# Patient Record
Sex: Male | Born: 1975 | Race: White | Hispanic: No | Marital: Married | Smoking: Never smoker
Health system: Southern US, Community
[De-identification: ages and names within clinical notes are randomized; demographics above are authoritative.]

## PROBLEM LIST (undated history)

## (undated) DIAGNOSIS — N200 Calculus of kidney: Secondary | ICD-10-CM

## (undated) HISTORY — PX: ADENOIDECTOMY: SUR15

## (undated) HISTORY — PX: OTHER SURGICAL HISTORY: SHX169

## (undated) HISTORY — DX: Calculus of kidney: N20.0

## (undated) HISTORY — PX: MOUTH SURGERY: SHX715

---

## 2004-06-27 ENCOUNTER — Emergency Department: Payer: Self-pay | Admitting: Emergency Medicine

## 2006-05-22 ENCOUNTER — Emergency Department: Payer: Self-pay | Admitting: Emergency Medicine

## 2013-12-19 ENCOUNTER — Ambulatory Visit: Payer: Self-pay

## 2016-10-05 ENCOUNTER — Emergency Department: Payer: 59

## 2016-10-05 ENCOUNTER — Emergency Department
Admission: EM | Admit: 2016-10-05 | Discharge: 2016-10-05 | Disposition: A | Discharge status: Home / Self Care | Payer: 59 | Attending: Emergency Medicine | Admitting: Emergency Medicine

## 2016-10-05 ENCOUNTER — Encounter: Payer: Self-pay | Admitting: Emergency Medicine

## 2016-10-05 DIAGNOSIS — N200 Calculus of kidney: Secondary | ICD-10-CM | POA: Diagnosis not present

## 2016-10-05 DIAGNOSIS — R1030 Lower abdominal pain, unspecified: Secondary | ICD-10-CM | POA: Diagnosis present

## 2016-10-05 LAB — URINALYSIS, COMPLETE (UACMP) WITH MICROSCOPIC
Bacteria, UA: NONE SEEN
Bilirubin Urine: NEGATIVE
GLUCOSE, UA: NEGATIVE mg/dL
KETONES UR: 20 mg/dL — AB
Leukocytes, UA: NEGATIVE
NITRITE: NEGATIVE
PH: 5 (ref 5.0–8.0)
PROTEIN: NEGATIVE mg/dL
Specific Gravity, Urine: 1.026 (ref 1.005–1.030)

## 2016-10-05 LAB — COMPREHENSIVE METABOLIC PANEL
ALBUMIN: 4.5 g/dL (ref 3.5–5.0)
ALT: 43 U/L (ref 17–63)
AST: 30 U/L (ref 15–41)
Alkaline Phosphatase: 59 U/L (ref 38–126)
Anion gap: 6 (ref 5–15)
BUN: 20 mg/dL (ref 6–20)
CHLORIDE: 109 mmol/L (ref 101–111)
CO2: 26 mmol/L (ref 22–32)
Calcium: 9.5 mg/dL (ref 8.9–10.3)
Creatinine, Ser: 1.03 mg/dL (ref 0.61–1.24)
GFR calc Af Amer: >60 mL/min (ref >60)
GFR calc non Af Amer: >60 mL/min (ref >60)
GLUCOSE: 120 mg/dL — AB (ref 65–99)
POTASSIUM: 3.9 mmol/L (ref 3.5–5.1)
Sodium: 141 mmol/L (ref 135–145)
Total Bilirubin: 0.8 mg/dL (ref 0.3–1.2)
Total Protein: 7.6 g/dL (ref 6.5–8.1)

## 2016-10-05 LAB — CBC
HCT: 44.4 % (ref 40.0–52.0)
HEMOGLOBIN: 15.5 g/dL (ref 13.0–18.0)
MCH: 31 pg (ref 26.0–34.0)
MCHC: 35 g/dL (ref 32.0–36.0)
MCV: 88.5 fL (ref 80.0–100.0)
PLATELETS: 254 10*3/uL (ref 150–440)
RBC: 5.01 MIL/uL (ref 4.40–5.90)
RDW: 13.7 % (ref 11.5–14.5)
WBC: 7.6 10*3/uL (ref 3.8–10.6)

## 2016-10-05 LAB — LIPASE, BLOOD: LIPASE: 38 U/L (ref 11–51)

## 2016-10-05 MED ORDER — ONDANSETRON 4 MG PO TBDP
4.0000 mg | ORAL_TABLET | Freq: Once | ORAL | Status: AC | PRN
  Administered 2016-10-05: 4 mg via ORAL
  Filled 2016-10-05: qty 1

## 2016-10-05 MED ORDER — TAMSULOSIN HCL 0.4 MG PO CAPS: 0.4000 mg | ORAL_CAPSULE | Freq: Every day | ORAL | 0 refills | Status: AC

## 2016-10-05 MED ORDER — TAMSULOSIN HCL 0.4 MG PO CAPS
0.4000 mg | ORAL_CAPSULE | Freq: Once | ORAL | Status: AC
  Administered 2016-10-05: 0.4 mg via ORAL
  Filled 2016-10-05: qty 1

## 2016-10-05 MED ORDER — OXYCODONE-ACETAMINOPHEN 5-325 MG PO TABS
1.0000 | ORAL_TABLET | Freq: Once | ORAL | Status: AC
  Administered 2016-10-05: 1 via ORAL
  Filled 2016-10-05: qty 1

## 2016-10-05 MED ORDER — OXYCODONE-ACETAMINOPHEN 5-325 MG PO TABS: 1.0000 | ORAL_TABLET | Freq: Four times a day (QID) | ORAL | 0 refills | Status: DC | PRN

## 2016-10-05 MED ORDER — KETOROLAC TROMETHAMINE 30 MG/ML IJ SOLN
15.0000 mg | Freq: Once | INTRAMUSCULAR | Status: AC
  Administered 2016-10-05: 15 mg via INTRAVENOUS
  Filled 2016-10-05: qty 1

## 2016-10-05 MED ORDER — FENTANYL CITRATE (PF) 100 MCG/2ML IJ SOLN
100.0000 ug | Freq: Once | INTRAMUSCULAR | Status: AC
  Administered 2016-10-05: 100 ug via INTRAVENOUS
  Filled 2016-10-05: qty 2

## 2016-10-05 NOTE — ED Triage Notes (Signed)
Pt states that he woke up around 0430 with extreme lower right sided abdominal pain. Pt states that he has urinary urgency and frequency since last night but denies any burning or odor to urine at this time. Pt is rocking back and forth in triage at this time.

## 2016-10-05 NOTE — ED Notes (Signed)
Marylene LandAngela RN aware of patient placement.

## 2016-10-05 NOTE — ED Provider Notes (Signed)
[facility] Emergency Department Provider Note  ____________________________________________   First MD Initiated Contact with Patient 10/05/16 0801     (approximate)  I have reviewed the triage vital signs and the nursing notes.   HISTORY  Chief Complaint Abdominal Pain    HPI Phillip Glenn is a 41 y.o. male who self presents to the emergency department with moderate to severe right sided flank pain radiating down to his groin that began last night shortly before going to bed. He initially went to sleep but woke up early in the morning with worsening pain. Associated with nausea but no vomiting. No fevers or chills. He has no history of abdominal surgeries. His pain is intermittent nothing seems to make it better or worse. At this time it is mild to moderate. He denies change in his urine. He denies dysuria, although does report urgency and frequency.   History reviewed. No pertinent past medical history.  There are no active problems to display for this patient.   Past Surgical History:  Procedure Laterality Date  . MOUTH SURGERY      Prior to Admission medications   Medication Sig Start Date End Date Taking? Authorizing Provider  oxyCODONE-acetaminophen (ROXICET) 5-325 MG tablet Take 1 tablet by mouth every 6 (six) hours as needed. 10/05/16 10/05/17  Merrily Brittle, MD  tamsulosin (FLOMAX) 0.4 MG CAPS capsule Take 1 capsule (0.4 mg total) by mouth daily. 10/05/16   Merrily Brittle, MD    Allergies Codeine  No family history on file.  Social History Social History  Substance Use Topics  . Smoking status: Never Smoker  . Smokeless tobacco: Never Used  . Alcohol use No    Review of Systems Constitutional: No fever/chills Eyes: No visual changes. ENT: No sore throat. Cardiovascular: Denies chest pain. Respiratory: Denies shortness of breath. Gastrointestinal: Positive abdominal pain.  Positive nausea, no vomiting.  No diarrhea.   No constipation. Genitourinary: Negative for dysuria. Musculoskeletal: Positive for back pain. Skin: Negative for rash. Neurological: Negative for headaches, focal weakness or numbness.   ____________________________________________   PHYSICAL EXAM:  VITAL SIGNS: ED Triage Vitals  Enc Vitals Group     BP 10/05/16 0457 (!) 163/90     Pulse Rate 10/05/16 0457 65     Resp 10/05/16 0457 18     Temp 10/05/16 0457 97.5 F (36.4 C)     Temp Source 10/05/16 0457 Oral     SpO2 10/05/16 0457 98 %     Weight 10/05/16 0459 (!) 310 lb (140.6 kg)     Height 10/05/16 0459 6\' 6"  (1.981 m)     Head Circumference --      Peak Flow --      Pain Score 10/05/16 0456 10     Pain Loc --      Pain Edu? --      Excl. in GC? --     Constitutional: Alert and oriented x 4 well appearing nontoxic no diaphoresis speaks in full, clear sentences Eyes: PERRL EOMI. Head: Atraumatic. Nose: No congestion/rhinnorhea. Mouth/Throat: No trismus Neck: No stridor.   Cardiovascular: Normal rate, regular rhythm. Grossly normal heart sounds.  Good peripheral circulation. Respiratory: Normal respiratory effort.  No retractions. Lungs CTAB and moving good air Gastrointestinal: Soft nondistended nontender no rebound or guarding no peritonitis no McBurney's tenderness negative Rovsing's no costovertebral tenderness Musculoskeletal: No lower extremity edema   Neurologic:  Normal speech and language. No gross focal neurologic deficits are appreciated. Skin:  Skin is  warm, dry and intact. No rash noted. Psychiatric: Mood and affect are normal. Speech and behavior are normal.    ____________________________________________   DIFFERENTIAL  Kidney stone, pyelonephritis, appendicitis, biliary colic, cholecystitis, volvulus, diverticulitis ____________________________________________   LABS (all labs ordered are listed, but only abnormal results are displayed)  Labs Reviewed  COMPREHENSIVE METABOLIC PANEL -  Abnormal; Notable for the following:       Result Value   Glucose, Bld 120 (*)    All other components within normal limits  URINALYSIS, COMPLETE (UACMP) WITH MICROSCOPIC - Abnormal; Notable for the following:    Color, Urine YELLOW (*)    APPearance HAZY (*)    Hgb urine dipstick LARGE (*)    Ketones, ur 20 (*)    Squamous Epithelial / LPF 0-5 (*)    All other components within normal limits  LIPASE, BLOOD  CBC    Hematuria with no signs of infection concerning for kidney stone __________________________________________  EKG   ____________________________________________  RADIOLOGY  CT scan shows a 2 mm stone with minimal hydronephrosis ____________________________________________   PROCEDURES  Procedure(s) performed: no  Procedures  Critical Care performed: no  Observation: no ____________________________________________   INITIAL IMPRESSION / ASSESSMENT AND PLAN / ED COURSE  Pertinent labs & imaging results that were available during my care of the patient were reviewed by me and considered in my medical decision making (see chart for details).  By the time I saw the patient she guarded been here several hours and he was relatively well-appearing but still appeared uncomfortable. His urinalysis is concerning for possible renal colic and as he's never had a stone before I think he warrants a CT scan to confirm this and to evaluate the location and size. The patient is agreeable and I will treat him with ketorolac and fentanyl in the meantime.     The patient does have a small kidney stone with only minimal hydronephrosis. Pain is significantly improved. At this point I will discharge him home with pain medication, Flomax, and strict return precautions. ____________________________________________   FINAL CLINICAL IMPRESSION(S) / ED DIAGNOSES  Final diagnoses:  Kidney stone      NEW MEDICATIONS STARTED DURING THIS VISIT:  Discharge Medication List as  of 10/05/2016  9:24 AM    START taking these medications   Details  oxyCODONE-acetaminophen (ROXICET) 5-325 MG tablet Take 1 tablet by mouth every 6 (six) hours as needed., Starting Sun 10/05/2016, Until Mon 10/05/2017, Print    tamsulosin (FLOMAX) 0.4 MG CAPS capsule Take 1 capsule (0.4 mg total) by mouth daily., Starting Sun 10/05/2016, Print         Note:  This document was prepared using Dragon voice recognition software and may include unintentional dictation errors.     Merrily Brittleifenbark, Rae S[place], MD 10/06/16 [phone]

## 2016-10-05 NOTE — Discharge Instructions (Addendum)
The most important thing to know about kidney stones is that if you develop a kidney infection with this stone it is life-threatening. If he develops a fever or chills he must return to the emergency department immediately for recheck. It is normal for your pain to last up to a full week. Please take her Percocet as needed for severe pain and follow-up with a urologist as needed.  It was a pleasure to take care of you today, and thank you for coming to our emergency department.  If you have any questions or concerns before leaving please ask the nurse to grab me and I'm more than happy to go through your aftercare instructions again.  If you were prescribed any opioid pain medication today such as Norco, Vicodin, Percocet, morphine, hydrocodone, or oxycodone please make sure you do not drive when you are taking this medication as it can alter your ability to drive safely.  If you have any concerns once you are home that you are not improving or are in fact getting worse before you can make it to your follow-up appointment, please do not hesitate to call 911 and come back for further evaluation.  Merrily Brittle MD  Results for orders placed or performed during the hospital encounter of 10/05/16  Lipase, blood  Result Value Ref Range   Lipase 38 11 - 51 U/L  Comprehensive metabolic panel  Result Value Ref Range   Sodium 141 135 - 145 mmol/L   Potassium 3.9 3.5 - 5.1 mmol/L   Chloride 109 101 - 111 mmol/L   CO2 26 22 - 32 mmol/L   Glucose, Bld 120 (H) 65 - 99 mg/dL   BUN 20 6 - 20 mg/dL   Creatinine, Ser 1.61 0.61 - 1.24 mg/dL   Calcium 9.5 8.9 - 09.6 mg/dL   Total Protein 7.6 6.5 - 8.1 g/dL   Albumin 4.5 3.5 - 5.0 g/dL   AST 30 15 - 41 U/L   ALT 43 17 - 63 U/L   Alkaline Phosphatase 59 38 - 126 U/L   Total Bilirubin 0.8 0.3 - 1.2 mg/dL   GFR calc non Af Amer >60 >60 mL/min   GFR calc Af Amer >60 >60 mL/min   Anion gap 6 5 - 15  CBC  Result Value Ref Range   WBC 7.6 3.8 - 10.6 K/uL   RBC 5.01 4.40 - 5.90 MIL/uL   Hemoglobin 15.5 13.0 - 18.0 g/dL   HCT 04.5 40.9 - 81.1 %   MCV 88.5 80.0 - 100.0 fL   MCH 31.0 26.0 - 34.0 pg   MCHC 35.0 32.0 - 36.0 g/dL   RDW 91.4 78.2 - 95.6 %   Platelets 254 150 - 440 K/uL  Urinalysis, Complete w Microscopic  Result Value Ref Range   Color, Urine YELLOW (A) YELLOW   APPearance HAZY (A) CLEAR   Specific Gravity, Urine 1.026 1.005 - 1.030   pH 5.0 5.0 - 8.0   Glucose, UA NEGATIVE NEGATIVE mg/dL   Hgb urine dipstick LARGE (A) NEGATIVE   Bilirubin Urine NEGATIVE NEGATIVE   Ketones, ur 20 (A) NEGATIVE mg/dL   Protein, ur NEGATIVE NEGATIVE mg/dL   Nitrite NEGATIVE NEGATIVE   Leukocytes, UA NEGATIVE NEGATIVE   RBC / HPF TOO NUMEROUS TO COUNT 0 - 5 RBC/hpf   WBC, UA 0-5 0 - 5 WBC/hpf   Bacteria, UA NONE SEEN NONE SEEN   Squamous Epithelial / LPF 0-5 (A) NONE SEEN   Mucous PRESENT  Ct Renal Stone Study  Result Date: 10/05/2016 CLINICAL DATA:  41 year old male with right flank and abdominal pain for 1 day. EXAM: CT ABDOMEN AND PELVIS WITHOUT CONTRAST TECHNIQUE: Multidetector CT imaging of the abdomen and pelvis was performed following the standard protocol without IV contrast. COMPARISON:  None. FINDINGS: Please note that parenchymal abnormalities may be missed without intravenous contrast. Lower chest: No acute abnormality Hepatobiliary: The liver and gallbladder are unremarkable. There is no evidence of biliary dilatation. Pancreas: Unremarkable Spleen: Unremarkable Adrenals/Urinary Tract: A 2 mm distal right ureteral calculus (5 cm above the UVJ) causes very mild right hydronephrosis. A punctate nonobstructing left renal calculus is noted. Two low-density left renal lesions probably represent cysts. The adrenal glands and bladder are unremarkable. Stomach/Bowel: Stomach is within normal limits. Appendix appears normal. No evidence of bowel wall thickening, distention, or inflammatory changes. Vascular/Lymphatic: No significant vascular  findings are present. No enlarged abdominal or pelvic lymph nodes. Reproductive: Prostate is unremarkable. Other: No ascites, focal collection or pneumoperitoneum. Musculoskeletal: No acute or significant osseous findings. IMPRESSION: 2 mm distal right ureteral calculus causing very mild right hydronephrosis. Punctate nonobstructing left renal calculus. Two low-density left renal lesions -likely cysts. Consider ultrasound confirmation. Electronically Signed   By: Harmon PierJeffrey  Hu M.D.   On: 10/05/2016 09:01

## 2016-10-07 ENCOUNTER — Encounter: Payer: Self-pay | Admitting: Emergency Medicine

## 2016-10-07 ENCOUNTER — Emergency Department
Admission: EM | Admit: 2016-10-07 | Discharge: 2016-10-07 | Disposition: A | Discharge status: Home / Self Care | Payer: 59 | Attending: Emergency Medicine | Admitting: Emergency Medicine

## 2016-10-07 DIAGNOSIS — R1110 Vomiting, unspecified: Secondary | ICD-10-CM | POA: Diagnosis present

## 2016-10-07 DIAGNOSIS — K5903 Drug induced constipation: Secondary | ICD-10-CM | POA: Diagnosis not present

## 2016-10-07 DIAGNOSIS — I10 Essential (primary) hypertension: Secondary | ICD-10-CM | POA: Diagnosis not present

## 2016-10-07 DIAGNOSIS — E860 Dehydration: Secondary | ICD-10-CM | POA: Insufficient documentation

## 2016-10-07 DIAGNOSIS — N200 Calculus of kidney: Secondary | ICD-10-CM | POA: Diagnosis not present

## 2016-10-07 LAB — COMPREHENSIVE METABOLIC PANEL
ALT: 34 U/L (ref 17–63)
AST: 23 U/L (ref 15–41)
Albumin: 4.1 g/dL (ref 3.5–5.0)
Alkaline Phosphatase: 60 U/L (ref 38–126)
Anion gap: 8 (ref 5–15)
BUN: 11 mg/dL (ref 6–20)
CHLORIDE: 108 mmol/L (ref 101–111)
CO2: 22 mmol/L (ref 22–32)
CREATININE: 1.45 mg/dL — AB (ref 0.61–1.24)
Calcium: 9.6 mg/dL (ref 8.9–10.3)
GFR calc Af Amer: >60 mL/min (ref >60)
GFR calc non Af Amer: 58 mL/min — ABNORMAL LOW (ref >60)
GLUCOSE: 109 mg/dL — AB (ref 65–99)
Potassium: 3.4 mmol/L — ABNORMAL LOW (ref 3.5–5.1)
Sodium: 138 mmol/L (ref 135–145)
Total Bilirubin: 1.4 mg/dL — ABNORMAL HIGH (ref 0.3–1.2)
Total Protein: 7.3 g/dL (ref 6.5–8.1)

## 2016-10-07 LAB — URINALYSIS, COMPLETE (UACMP) WITH MICROSCOPIC
Bacteria, UA: NONE SEEN
Bilirubin Urine: NEGATIVE
Glucose, UA: NEGATIVE mg/dL
Hgb urine dipstick: NEGATIVE
KETONES UR: 5 mg/dL — AB
Leukocytes, UA: NEGATIVE
Nitrite: NEGATIVE
PH: 5 (ref 5.0–8.0)
Protein, ur: NEGATIVE mg/dL
SPECIFIC GRAVITY, URINE: 1.021 (ref 1.005–1.030)

## 2016-10-07 LAB — CBC
HCT: 44.6 % (ref 40.0–52.0)
Hemoglobin: 15.3 g/dL (ref 13.0–18.0)
MCH: 30.3 pg (ref 26.0–34.0)
MCHC: 34.4 g/dL (ref 32.0–36.0)
MCV: 88.1 fL (ref 80.0–100.0)
PLATELETS: 255 10*3/uL (ref 150–440)
RBC: 5.06 MIL/uL (ref 4.40–5.90)
RDW: 13.3 % (ref 11.5–14.5)
WBC: 12.6 10*3/uL — ABNORMAL HIGH (ref 3.8–10.6)

## 2016-10-07 LAB — LIPASE, BLOOD: LIPASE: 30 U/L (ref 11–51)

## 2016-10-07 MED ORDER — ONDANSETRON 4 MG PO TBDP: 4.0000 mg | ORAL_TABLET | Freq: Three times a day (TID) | ORAL | 0 refills | Status: AC | PRN

## 2016-10-07 MED ORDER — OXYCODONE-ACETAMINOPHEN 5-325 MG PO TABS: 1.0000 | ORAL_TABLET | Freq: Four times a day (QID) | ORAL | 0 refills | Status: AC | PRN

## 2016-10-07 MED ORDER — ONDANSETRON 4 MG PO TBDP: 4.0000 mg | ORAL_TABLET | Freq: Three times a day (TID) | ORAL | 0 refills | Status: DC | PRN

## 2016-10-07 MED ORDER — FENTANYL CITRATE (PF) 100 MCG/2ML IJ SOLN
INTRAMUSCULAR | Status: AC
  Administered 2016-10-07: 50 ug via INTRAVENOUS
  Filled 2016-10-07: qty 2

## 2016-10-07 MED ORDER — ONDANSETRON HCL 4 MG/2ML IJ SOLN
INTRAMUSCULAR | Status: AC
  Administered 2016-10-07: 4 mg via INTRAVENOUS
  Filled 2016-10-07: qty 2

## 2016-10-07 MED ORDER — TAMSULOSIN HCL 0.4 MG PO CAPS
0.4000 mg | ORAL_CAPSULE | Freq: Once | ORAL | Status: AC
  Administered 2016-10-07: 0.4 mg via ORAL
  Filled 2016-10-07: qty 1

## 2016-10-07 MED ORDER — IBUPROFEN 800 MG PO TABS: 800.0000 mg | ORAL_TABLET | Freq: Three times a day (TID) | ORAL | 0 refills | Status: AC | PRN

## 2016-10-07 MED ORDER — MORPHINE SULFATE (PF) 4 MG/ML IV SOLN
4.0000 mg | Freq: Once | INTRAVENOUS | Status: AC
  Administered 2016-10-07: 4 mg via INTRAVENOUS
  Filled 2016-10-07: qty 1

## 2016-10-07 MED ORDER — SODIUM CHLORIDE 0.9 % IV BOLUS (SEPSIS)
1000.0000 mL | Freq: Once | INTRAVENOUS | Status: AC
  Administered 2016-10-07: 1000 mL via INTRAVENOUS

## 2016-10-07 MED ORDER — FENTANYL CITRATE (PF) 100 MCG/2ML IJ SOLN
50.0000 ug | INTRAMUSCULAR | Status: DC | PRN
  Administered 2016-10-07: 50 ug via INTRAVENOUS

## 2016-10-07 MED ORDER — KETOROLAC TROMETHAMINE 30 MG/ML IJ SOLN
15.0000 mg | Freq: Once | INTRAMUSCULAR | Status: AC
  Administered 2016-10-07: 15 mg via INTRAVENOUS
  Filled 2016-10-07: qty 1

## 2016-10-07 MED ORDER — ONDANSETRON HCL 4 MG/2ML IJ SOLN
4.0000 mg | Freq: Once | INTRAMUSCULAR | Status: AC
Start: 2016-10-07 — End: 2016-10-07
  Administered 2016-10-07: 4 mg via INTRAVENOUS
  Filled 2016-10-07: qty 2

## 2016-10-07 MED ORDER — ONDANSETRON HCL 4 MG/2ML IJ SOLN
4.0000 mg | Freq: Once | INTRAMUSCULAR | Status: AC | PRN
  Administered 2016-10-07: 4 mg via INTRAVENOUS

## 2016-10-07 NOTE — ED Provider Notes (Signed)
-----------------------------------------   10:30 PM on 10/07/2016 -----------------------------------------   END OF OBSERVATION STATUS: After an appropriate period of observation, this patient is being discharged due to the following reason(s):  Pain is well controlled and no signs of urinary tract infection.    Merrily Brittleifenbark, Analy [place], MD 10/07/16 2230

## 2016-10-07 NOTE — ED Provider Notes (Signed)
[facility] Emergency Department Provider Note  ____________________________________________  Time seen: Approximately 7:37 PM  I have reviewed the triage vital signs and the nursing notes.   HISTORY  Chief Complaint Abdominal Pain and Emesis   HPI Phillip Glenn is a 41 y.o. male diagnosed with a 2 mm right kidney stone 2 days ago who presents for evaluation of pain, nausea, vomiting, and inability to tolerate by mouth. Patient reports that he was sent home on Flomax and Percocet but was not given any anti-emetics. He has been vomiting since being discharged from the hospital. He is also complaining of severe pain that is located in his right flank radiating to his right groin has been constant since yesterday. Patient reports decreased urine output and said that he had not urinated the whole day yesterday. Patient has urinated 4 times today. No hematuria. No dysuria or frequency. He denies fever but has had chills. Patient also endorses 3 days of constipation which is not common for him. No prior abdominal surgeries, no abdominal distention, no history of SBO. Patient is passing flatus.  PMH Hyperlipidemia HTN Fatty liver   Past Surgical History:  Procedure Laterality Date  . MOUTH SURGERY      Prior to Admission medications   Medication Sig Start Date End Date Taking? Authorizing Provider  acetaminophen (TYLENOL) 500 MG tablet Take 1,000 mg by mouth every 6 (six) hours as needed.   Yes [provider]  tamsulosin (FLOMAX) 0.4 MG CAPS capsule Take 1 capsule (0.4 mg total) by mouth daily. 10/05/16  Yes Merrily Brittleifenbark, Neil, MD  ibuprofen (ADVIL,MOTRIN) 800 MG tablet Take 1 tablet (800 mg total) by mouth every 8 (eight) hours as needed. 10/07/16   Nita SickleVeronese, [place], MD  ondansetron (ZOFRAN ODT) 4 MG disintegrating tablet Take 1 tablet (4 mg total) by mouth every 8 (eight) hours as needed for nausea or vomiting. 10/07/16   Don PerkingVeronese, [place], MD    oxyCODONE-acetaminophen (ROXICET) 5-325 MG tablet Take 1 tablet by mouth every 6 (six) hours as needed. 10/07/16 10/07/17  Nita SickleVeronese, [place], MD    Allergies Codeine  Family History Colon cancer Father    Liver cancer Father    Cancer Other    Diabetes type II Other    Heart disease Other    Arthritis Other grandfather   Diabetes Other grandfather   Heart disease Other grandfather   Arthritis Other grandmother      Social History Social History  Substance Use Topics  . Smoking status: Never Smoker  . Smokeless tobacco: Never Used  . Alcohol use No    Review of Systems  Constitutional: Negative for fever. Eyes: Negative for visual changes. ENT: Negative for sore throat. Neck: No neck pain  Cardiovascular: Negative for chest pain. Respiratory: Negative for shortness of breath. Gastrointestinal: Negative for abdominal pain, diarrhea. + N/V Genitourinary: Negative for dysuria. + R flank pain Musculoskeletal: Negative for back pain. Skin: Negative for rash. Neurological: Negative for headaches, weakness or numbness. Psych: No SI or HI  ____________________________________________   PHYSICAL EXAM:  VITAL SIGNS: ED Triage Vitals  Enc Vitals Group     BP 10/07/16 1822 124/82     Pulse Rate 10/07/16 1822 77     Resp 10/07/16 1822 16     Temp 10/07/16 1822 98.7 F (37.1 C)     Temp Source 10/07/16 1822 Oral     SpO2 10/07/16 1822 98 %     Weight 10/07/16 1820 (!) 310 lb (140.6 kg)  Height 10/07/16 1820 6\' 6"  (1.981 m)     Head Circumference --      Peak Flow --      Pain Score 10/07/16 1820 9     Pain Loc --      Pain Edu? --      Excl. in GC? --     Constitutional: Alert and oriented, in obvious distress due to pain.  HEENT:      Head: Normocephalic and atraumatic.         Eyes: Conjunctivae are normal. Sclera is non-icteric.       Mouth/Throat: Mucous membranes are dry.       Neck: Supple with no signs of meningismus. Cardiovascular:  Regular rate and rhythm. No murmurs, gallops, or rubs. 2+ symmetrical distal pulses are present in all extremities. No JVD. Respiratory: Normal respiratory effort. Lungs are clear to auscultation bilaterally. No wheezes, crackles, or rhonchi.  Gastrointestinal: Soft, non tender, and non distended with positive bowel sounds. No rebound or guarding. Genitourinary: No CVA tenderness. Musculoskeletal: Nontender with normal range of motion in all extremities. No edema, cyanosis, or erythema of extremities. Neurologic: Normal speech and language. Face is symmetric. Moving all extremities. No gross focal neurologic deficits are appreciated. Skin: Skin is warm, dry and intact. No rash noted. Psychiatric: Mood and affect are normal. Speech and behavior are normal.  ____________________________________________   LABS (all labs ordered are listed, but only abnormal results are displayed)  Labs Reviewed  COMPREHENSIVE METABOLIC PANEL - Abnormal; Notable for the following:       Result Value   Potassium 3.4 (*)    Glucose, Bld 109 (*)    Creatinine, Ser 1.45 (*)    Total Bilirubin 1.4 (*)    GFR calc non Af Amer 58 (*)    All other components within normal limits  CBC - Abnormal; Notable for the following:    WBC 12.6 (*)    All other components within normal limits  LIPASE, BLOOD  URINALYSIS, COMPLETE (UACMP) WITH MICROSCOPIC   ____________________________________________  EKG  none  ____________________________________________  RADIOLOGY  none  ____________________________________________   PROCEDURES  Procedure(s) performed: None Procedures Critical Care performed:  None ____________________________________________   INITIAL IMPRESSION / ASSESSMENT AND PLAN / ED COURSE  41 y.o. male diagnosed with a 2 mm right kidney stone 2 days ago who presents for evaluation of pain, nausea, vomiting, and inability to tolerate by mouth. Patient looks dry and exam, has normal vital signs.  Patient is obviously uncomfortable. His abdomen is soft with no distention and no tenderness, has positive bowel sounds. Presentation concerning for pain and dehydration from his kidney stone. Creatinine is slightly increased to 1.45 from 1.03, 2 days ago. Leukocytosis with WBC 12.6. LFTs and lipase WNL. Will give IVF, toradol, morphine, flomax, zofran. UA pending to rule out superimposed infection.    ----------------------------------------- 7:37 PM on 10/07/2016 -----------------------------------------   OBSERVATION CARE: This patient is being placed under observation care for the following reasons: Kidney stone observed to repeat x-rays and/or adequate pain control for possible admission   _________________________ 8:30 PM on 10/07/2016 -----------------------------------------  Patient feels improved. UA still pending/ Care transferred to Dr. Lamont Snowball.     Pertinent labs & imaging results that were available during my care of the patient were reviewed by me and considered in my medical decision making (see chart for details).    ____________________________________________   FINAL CLINICAL IMPRESSION(S) / ED DIAGNOSES  Final diagnoses:  Kidney stone  Dehydration  Drug-induced  constipation      NEW MEDICATIONS STARTED DURING THIS VISIT:  New Prescriptions   IBUPROFEN (ADVIL,MOTRIN) 800 MG TABLET    Take 1 tablet (800 mg total) by mouth every 8 (eight) hours as needed.   ONDANSETRON (ZOFRAN ODT) 4 MG DISINTEGRATING TABLET    Take 1 tablet (4 mg total) by mouth every 8 (eight) hours as needed for nausea or vomiting.   OXYCODONE-ACETAMINOPHEN (ROXICET) 5-325 MG TABLET    Take 1 tablet by mouth every 6 (six) hours as needed.     Note:  This document was prepared using Dragon voice recognition software and may include unintentional dictation errors.    Don Perking, [place], MD 10/07/16 2031

## 2016-10-07 NOTE — ED Notes (Signed)

## 2016-10-07 NOTE — Discharge Instructions (Signed)
Constipation: Take colace twice a day everyday. Take senna once a day at bedtime. Take daily probiotics. Drink plenty of fluids and eat a diet rich in fiber. If you go more than 3 days without a bowel movement, take 1 cap full of Miralax in the morning and one in the evening up to 5 days.    You have been seen in the Emergency Department (ED)  Today and was diagnosed with kidney stones. While the stone is traveling through the ureter, which is the tube that carries urine from the kidney to the bladder, you will probably feel pain. The pain may be mild or very severe. You may also have some blood in your urine. As soon as the stone reaches the bladder, any intense pain should go away. If a stone is too large to pass on its own, you may need a medical procedure to help you pass the stone.   As we have discussed, please drink plenty of fluids and use a urinary strainer to attempt to capture the stone.  Please make a follow up appointment with Urology in the next week by calling the number below and bring the stone with you.  Take ibuprofen 600mg  every 6 hours for the pain. If the pain is not well controlled with ibuprofen you may take one percocet every 4 hours. Do not take tylenol while taking percocet. Please also take your prescribed flomax daily. Check with your doctor if you have a history of gastritis, stomach ulcers, renal failure or impaired kidney function as you may not be able to take ibuprofen/ motrin. Your doctor can give you a different prescription for pain control.  Follow-up with your doctor or return to the ER in 12-24 hours if your pain is not well controlled, if you develop pain or burning with urination, or if you develop a fever. Otherwise follow up in 3-5 days with your doctor.  When should you call for help?  Call your doctor now or seek immediate medical care if:  You cannot keep down fluids.  Your pain gets worse.  You have a fever or chills.  You have new or worse pain in your  back just below your rib cage (the flank area).  You have new or more blood in your urine. You have pain or burning with urination You are unable to urinate You have abdominal pain  Watch closely for changes in your health, and be sure to contact your doctor if:  You do not get better as expected  How can you care for yourself at home?  Drink plenty of fluids, enough so that your urine is light yellow or clear like water. If you have kidney, heart, or liver disease and have to limit fluids, talk with your doctor before you increase the amount of fluids you drink.  Take pain medicines exactly as directed. Call your doctor if you think you are having a problem with your medicine.  If the doctor gave you a prescription medicine for pain, take it as prescribed.  If you are not taking a prescription pain medicine, ask your doctor if you can take an over-the-counter medicine. Read and follow all instructions on the label. Your doctor may ask you to strain your urine so that you can collect your kidney stone when it passes. You can use a kitchen strainer or a tea strainer to catch the stone. Store it in a plastic bag until you see your doctor again.  Preventing future kidney stones  Some changes in your diet may help prevent kidney stones. Depending on the cause of your stones, your doctor may recommend that you:  Drink plenty of fluids, enough so that your urine is light yellow or clear like water. If you have kidney, heart, or liver disease and have to limit fluids, talk with your doctor before you increase the amount of fluids you drink.  Limit coffee, tea, and alcohol. Also avoid grapefruit juice.  Do not take more than the recommended daily dose of vitamins C and D.  Avoid antacids such as Gaviscon, Maalox, Mylanta, or Tums.  Limit the amount of salt (sodium) in your diet.  Eat a balanced diet that is not too high in protein.  Limit foods that are high in a substance called oxalate, which can  cause kidney stones. These foods include dark green vegetables, rhubarb, chocolate, wheat bran, nuts, cranberries, and beans.

## 2016-10-07 NOTE — ED Notes (Signed)
Pt reports that he did not urinate at all yesterday until he got in the shower late at night; that was the only way he could go and alleviate the pressure. Pt states he has urinated 4x today.

## 2016-10-07 NOTE — ED Triage Notes (Signed)
SEen through ED on Saturday evening and diagnosed with kidney stone. D/C home with flomax and Percocet.  Pain has persisted.  Patient continue to be nauseated.  Last medicated this morning, but patient unable to keep pills down.

## 2016-10-07 NOTE — ED Notes (Signed)
Pt presents with abdominal pain and nausea since Saturday morning. He was seen here on Sunday early morning. He was discharged with flomax and percocet. He has been taking them but has been vomiting every time he eats or drinks anything since Sunday. He states he has not had a bm since Sunday but has vomited 15 x in last 24 hours. Frequency of vomiting increased yesterday. Pt states he thinks he passed the stone, that the flank pain is not as bad, but "everything hurts now" and he can't even keep water down. Pt moaning during assessment.

## 2016-10-07 NOTE — ED Notes (Signed)
Pt assisted in standing at bedside to use urinal by this tech

## 2016-10-07 NOTE — ED Notes (Signed)
Pt aware urine specimen is needed. Pt verbalizes understanding of this. Urinal at bedside.

## 2016-10-10 ENCOUNTER — Emergency Department
Admission: EM | Admit: 2016-10-10 | Discharge: 2016-10-11 | Disposition: A | Discharge status: Home / Self Care | Payer: 59 | Attending: Emergency Medicine | Admitting: Emergency Medicine

## 2016-10-10 ENCOUNTER — Emergency Department: Payer: 59

## 2016-10-10 DIAGNOSIS — N201 Calculus of ureter: Secondary | ICD-10-CM | POA: Insufficient documentation

## 2016-10-10 DIAGNOSIS — K5900 Constipation, unspecified: Secondary | ICD-10-CM | POA: Diagnosis not present

## 2016-10-10 DIAGNOSIS — Z7982 Long term (current) use of aspirin: Secondary | ICD-10-CM | POA: Insufficient documentation

## 2016-10-10 DIAGNOSIS — R1084 Generalized abdominal pain: Secondary | ICD-10-CM | POA: Diagnosis not present

## 2016-10-10 DIAGNOSIS — N200 Calculus of kidney: Secondary | ICD-10-CM

## 2016-10-10 DIAGNOSIS — R109 Unspecified abdominal pain: Secondary | ICD-10-CM | POA: Diagnosis present

## 2016-10-10 DIAGNOSIS — R339 Retention of urine, unspecified: Secondary | ICD-10-CM | POA: Diagnosis not present

## 2016-10-10 LAB — CBC WITH DIFFERENTIAL/PLATELET
Basophils Absolute: 0.1 10*3/uL (ref 0–0.1)
Basophils Relative: 1 %
EOS ABS: 0.1 10*3/uL (ref 0–0.7)
EOS PCT: 1 %
HCT: 40.3 % (ref 40.0–52.0)
Hemoglobin: 14.1 g/dL (ref 13.0–18.0)
LYMPHS ABS: 1.1 10*3/uL (ref 1.0–3.6)
Lymphocytes Relative: 9 %
MCH: 30.3 pg (ref 26.0–34.0)
MCHC: 35 g/dL (ref 32.0–36.0)
MCV: 86.6 fL (ref 80.0–100.0)
Monocytes Absolute: 1 10*3/uL (ref 0.2–1.0)
Monocytes Relative: 8 %
Neutro Abs: 9.4 10*3/uL — ABNORMAL HIGH (ref 1.4–6.5)
Neutrophils Relative %: 81 %
PLATELETS: 269 10*3/uL (ref 150–440)
RBC: 4.66 MIL/uL (ref 4.40–5.90)
RDW: 13.1 % (ref 11.5–14.5)
WBC: 11.6 10*3/uL — AB (ref 3.8–10.6)

## 2016-10-10 LAB — LIPASE, BLOOD: Lipase: 27 U/L (ref 11–51)

## 2016-10-10 LAB — HEPATIC FUNCTION PANEL
ALT: 34 U/L (ref 17–63)
AST: 25 U/L (ref 15–41)
Albumin: 4 g/dL (ref 3.5–5.0)
Alkaline Phosphatase: 70 U/L (ref 38–126)
Bilirubin, Direct: 0.2 mg/dL (ref 0.1–0.5)
Indirect Bilirubin: 1 mg/dL — ABNORMAL HIGH (ref 0.3–0.9)
Total Bilirubin: 1.2 mg/dL (ref 0.3–1.2)
Total Protein: 7.3 g/dL (ref 6.5–8.1)

## 2016-10-10 LAB — BASIC METABOLIC PANEL
Anion gap: 7 (ref 5–15)
BUN: 15 mg/dL (ref 6–20)
CALCIUM: 9.6 mg/dL (ref 8.9–10.3)
CO2: 25 mmol/L (ref 22–32)
CREATININE: 1.73 mg/dL — AB (ref 0.61–1.24)
Chloride: 104 mmol/L (ref 101–111)
GFR calc Af Amer: 54 mL/min — ABNORMAL LOW (ref >60)
GFR, EST NON AFRICAN AMERICAN: 47 mL/min — AB (ref >60)
Glucose, Bld: 115 mg/dL — ABNORMAL HIGH (ref 65–99)
POTASSIUM: 4 mmol/L (ref 3.5–5.1)
SODIUM: 136 mmol/L (ref 135–145)

## 2016-10-10 MED ORDER — ONDANSETRON HCL 4 MG/2ML IJ SOLN
INTRAMUSCULAR | Status: AC
  Administered 2016-10-10: 4 mg via INTRAVENOUS
  Filled 2016-10-10: qty 2

## 2016-10-10 MED ORDER — MORPHINE SULFATE (PF) 4 MG/ML IV SOLN
INTRAVENOUS | Status: AC
  Administered 2016-10-10: 4 mg via INTRAVENOUS
  Filled 2016-10-10: qty 1

## 2016-10-10 MED ORDER — MORPHINE SULFATE (PF) 4 MG/ML IV SOLN
4.0000 mg | Freq: Once | INTRAVENOUS | Status: AC
  Administered 2016-10-10: 4 mg via INTRAVENOUS

## 2016-10-10 MED ORDER — ONDANSETRON HCL 4 MG/2ML IJ SOLN
4.0000 mg | Freq: Once | INTRAMUSCULAR | Status: AC
  Administered 2016-10-10: 4 mg via INTRAVENOUS

## 2016-10-10 MED ORDER — SODIUM CHLORIDE 0.9 % IV BOLUS (SEPSIS)
1000.0000 mL | Freq: Once | INTRAVENOUS | Status: AC
  Administered 2016-10-10: 1000 mL via INTRAVENOUS

## 2016-10-10 NOTE — ED Provider Notes (Signed)
[facility] Emergency Department Provider Note   ____________________________________________   First MD Initiated Contact with Patient 10/10/16 2325     (approximate)  I have reviewed the triage vital signs and the nursing notes.   HISTORY  Chief Complaint Flank Pain and Urinary Retention    HPI Phillip Glenn is a 41 y.o. male who comes into the hospital today with some abdominal pain and difficulty urinating. The patient was here last Sunday morning with severe right lower quadrant pain. The family was concerned that the patient may have had appendicitis but it was found that the patient had a kidney stone. He was sent home with oxycodone and Flomax. Since then he has had difficulty urinating and is only able to do soin a hot shower. She reports that he's been in a lot of painso they return to the emergency department on Tuesday. He had some severe dehydration and vomiting. He was given some fluids and had a bladder scan. The patient's wife reports that his bladder was full and it took him over 5 hours to urinate. He was told to follow-up with urology and has an appointment scheduled for this coming Wednesday. The patient's wife also reports that he has been unable to have a bowel movement for the past week. This is been going on since before he came with the kidney stone. He tried laxatives Colace and fiber all with no success. The patient rates his pain a 10 out of 10 in intensity. His pain is epigastric in the last time he urinated today was around 1 PM. The patient is here today for evaluation.   History reviewed. No pertinent past medical history.  There are no active problems to display for this patient.   Past Surgical History:  Procedure Laterality Date  . MOUTH SURGERY      Prior to Admission medications   Medication Sig Start Date End Date Taking? Authorizing Provider  acetaminophen (TYLENOL) 500 MG tablet Take 1,000 mg by mouth every 6  (six) hours as needed.   Yes [provider]  ibuprofen (ADVIL,MOTRIN) 800 MG tablet Take 1 tablet (800 mg total) by mouth every 8 (eight) hours as needed. 10/07/16  Yes Don Perking, [place], MD  ondansetron (ZOFRAN ODT) 4 MG disintegrating tablet Take 1 tablet (4 mg total) by mouth every 8 (eight) hours as needed for nausea or vomiting. 10/07/16  Yes Don Perking, [place], MD  oxyCODONE-acetaminophen (ROXICET) 5-325 MG tablet Take 1 tablet by mouth every 6 (six) hours as needed. 10/07/16 10/07/17 Yes Veronese, [place], MD  tamsulosin (FLOMAX) 0.4 MG CAPS capsule Take 1 capsule (0.4 mg total) by mouth daily. 10/05/16  Yes Merrily Brittle, MD  docusate sodium (COLACE) 100 MG capsule Take 1 capsule (100 mg total) by mouth 2 (two) times daily. 10/11/16 10/11/17  Rebecka Apley, MD  ketorolac (TORADOL) 10 MG tablet Take 1 tablet (10 mg total) by mouth every 6 (six) hours as needed. 10/11/16   Rebecka Apley, MD  metoCLOPramide (REGLAN) 10 MG tablet Take 1 tablet (10 mg total) by mouth 3 (three) times daily with meals. 10/11/16 10/11/17  Rebecka Apley, MD  polyethylene glycol [facility]) packet Take 17 g by mouth 2 (two) times daily. 10/11/16   Rebecka Apley, MD    Allergies Codeine  No family history on file.  Social History Social History  Substance Use Topics  . Smoking status: Never Smoker  . Smokeless tobacco: Never Used  . Alcohol use No  Review of Systems  Constitutional: No fever/chills Eyes: No visual changes. ENT: No sore throat. Cardiovascular: Denies chest pain. Respiratory: Denies shortness of breath. Gastrointestinal:  abdominal pain.  nausea, vomiting, constipation. Genitourinary: urinary retention Musculoskeletal: Negative for back pain. Skin: Negative for rash. Neurological: Negative for headaches, focal weakness or numbness.   ____________________________________________   PHYSICAL EXAM:  VITAL SIGNS: ED Triage Vitals  Enc Vitals Group     BP  10/10/16 2213 (!) 157/88     Pulse Rate 10/10/16 2213 63     Resp 10/10/16 2213 20     Temp 10/10/16 2213 98.4 F (36.9 C)     Temp Source 10/10/16 2213 Oral     SpO2 10/10/16 2213 99 %     Weight 10/10/16 2214 (!) 310 lb (140.6 kg)     Height 10/10/16 2214 6\' 6"  (1.981 m)     Head Circumference --      Peak Flow --      Pain Score 10/10/16 2213 10     Pain Loc --      Pain Edu? --      Excl. in GC? --     Constitutional: Alert and oriented. Well appearing and in moderate to severe distress. Eyes: Conjunctivae are normal. PERRL. EOMI. Head: Atraumatic. Nose: No congestion/rhinnorhea. Mouth/Throat: Mucous membranes are moist.  Oropharynx non-erythematous. Cardiovascular: Normal rate, regular rhythm. Grossly normal heart sounds.  Good peripheral circulation. Respiratory: Normal respiratory effort.  No retractions. Lungs CTAB. Gastrointestinal: diffuse abd tenderness to palpation with guarding. No distention .positive bowel sounds Musculoskeletal: No lower extremity tenderness nor edema.  Neurologic:  Normal speech and language.  Skin:  Skin is warm, dry and intact.  Psychiatric: Mood and affect are normal. Speech and behavior are normal.  ____________________________________________   LABS (all labs ordered are listed, but only abnormal results are displayed)  Labs Reviewed  CBC WITH DIFFERENTIAL/PLATELET - Abnormal; Notable for the following:       Result Value   WBC 11.6 (*)    Neutro Abs 9.4 (*)    All other components within normal limits  BASIC METABOLIC PANEL - Abnormal; Notable for the following:    Glucose, Bld 115 (*)    Creatinine, Ser 1.73 (*)    GFR calc non Af Amer 47 (*)    GFR calc Af Amer 54 (*)    All other components within normal limits  URINALYSIS, COMPLETE (UACMP) WITH MICROSCOPIC - Abnormal; Notable for the following:    Color, Urine AMBER (*)    APPearance CLEAR (*)    Hgb urine dipstick SMALL (*)    All other components within normal limits    HEPATIC FUNCTION PANEL - Abnormal; Notable for the following:    Indirect Bilirubin 1.0 (*)    All other components within normal limits  URINE CULTURE  LIPASE, BLOOD   ____________________________________________  EKG  none ____________________________________________  RADIOLOGY  Ct Renal Stone Study  Result Date: 10/11/2016 CLINICAL DATA:  Right flank pain with urinary retention EXAM: CT ABDOMEN AND PELVIS WITHOUT CONTRAST TECHNIQUE: Multidetector CT imaging of the abdomen and pelvis was performed following the standard protocol without IV contrast. COMPARISON:  10/05/2016 FINDINGS: Lower chest: Lung bases demonstrate no acute consolidation or pleural effusion. Normal heart size. Hepatobiliary: No focal liver abnormality is seen. No gallstones, gallbladder wall thickening, or biliary dilatation. Pancreas: Unremarkable. No pancreatic ductal dilatation or surrounding inflammatory changes. Spleen: Normal in size without focal abnormality. Adrenals/Urinary Tract: Adrenal glands are within normal limits. Low-density  lesions in the mid and upper left kidney with minimal calcification. Mild right hydronephrosis and hydroureter, secondary to a 3 mm stone which is now located at the right UVJ. Foley catheter in the bladder. Stomach/Bowel: Stomach is within normal limits. Appendix appears normal. No evidence of bowel wall thickening, distention, or inflammatory changes. Vascular/Lymphatic: No significant vascular findings are present. No enlarged abdominal or pelvic lymph nodes. Reproductive: Prostate is unremarkable. Other: Small amount of free fluid in the pelvis. No free air. Small fat in the umbilicus. Musculoskeletal: No acute or significant osseous findings. IMPRESSION: 1. Continued mild right hydronephrosis and hydroureter, this is secondary to a 2-3 mm stone in the distal right ureter which is now visualized at the right UVJ. 2. Low density lesions in the left kidney, probably cysts Electronically  Signed   By: Jasmine Pang M.D.   On: 10/11/2016 01:18    ____________________________________________   PROCEDURES  Procedure(s) performed: None  Procedures  Critical Care performed: No  ____________________________________________   INITIAL IMPRESSION / ASSESSMENT AND PLAN / ED COURSE  Pertinent labs & imaging results that were available during my care of the patient were reviewed by me and considered in my medical decision making (see chart for details).  His is a 41 year old male who comes into the hospital today with some severe abdominal pain, nausea and vomiting. The patient did have a kidney stone diagnosed approximately one week ago. It appears that the patient's creatinine is elevated at 1.73 which is higher than it was when he initially came a week ago. The patient receive a liter of normal saline. I will check hepatic function panel and a lipase and I will consider repeating the patient's CT scan for further evaluation. The patient will be reassessed He did receive some morphine for his pain.  Clinical Course as of Oct 11 229  Sat Oct 11, 2016  0128 IMPRESSION: 1. Continued mild right hydronephrosis and hydroureter, this is secondary to a 2-3 mm stone in the distal right ureter which is now visualized at the right UVJ. 2. Low density lesions in the left kidney, probably cysts   CT Renal [facility] [place] [AW]    Clinical Course User Index [AW] Rebecka Apley, MD   The patient's CT scan shows hydroureter secondary to a 2-3 mm stone at the right UVJ. The patient had some relief after his catheter was placed. He started having pain again and nausea so I did give him some Toradol and Reglan. The patient is also constipated. He received a liter of normal saline. I will discharge the patient to follow-up with urology. He will have a leg bag as he does not have any infection and I'll also give him medication for constipation. The patient be discharged to follow-up. He has no  further questions at this time.  ____________________________________________   FINAL CLINICAL IMPRESSION(S) / ED DIAGNOSES  Final diagnoses:  Kidney stone  Urinary retention  Constipation, unspecified constipation type  Generalized abdominal pain      NEW MEDICATIONS STARTED DURING THIS VISIT:  New Prescriptions   DOCUSATE SODIUM (COLACE) 100 MG CAPSULE    Take 1 capsule (100 mg total) by mouth 2 (two) times daily.   KETOROLAC (TORADOL) 10 MG TABLET    Take 1 tablet (10 mg total) by mouth every 6 (six) hours as needed.   METOCLOPRAMIDE (REGLAN) 10 MG TABLET    Take 1 tablet (10 mg total) by mouth 3 (three) times daily with meals.   POLYETHYLENE GLYCOL (MIRALAX)  PACKET    Take 17 g by mouth 2 (two) times daily.     Note:  This document was prepared using Dragon voice recognition software and may include unintentional dictation errors.    Rebecka ApleyWebster, Pancho Rushing P, MD 10/11/16 [phone]

## 2016-10-10 NOTE — ED Notes (Signed)
Bladder scan showed 240 ml urine.

## 2016-10-10 NOTE — ED Triage Notes (Signed)
Pt comes via [facility] from home with c/o right flank pain and urinary retention. Pt states he was dx [facility] at [facility] ED with kidney stones. He also states he hasn't had a BM in over a week. Pt states he took some Zofran before EMS was called and took some oxycodone this am with no relief. Pt states he has also taken 600mg  of Ibuprofen at 1:00 today still no relief. Per EMS they gave pt 30 of tardal. Pt appears to be in severe pain. Respirations even and unlabored.

## 2016-10-10 NOTE — ED Notes (Signed)
Pt. States he has been constipated for the past 9 days.  Pt. States he was here this past Sunday and dx with kidney stones.  Pt. States "I think I passed kidney stones".  Pt. Reports abdominal cramping.

## 2016-10-11 LAB — URINALYSIS, COMPLETE (UACMP) WITH MICROSCOPIC
Bacteria, UA: NONE SEEN
Bilirubin Urine: NEGATIVE
GLUCOSE, UA: NEGATIVE mg/dL
Ketones, ur: NEGATIVE mg/dL
Leukocytes, UA: NEGATIVE
Nitrite: NEGATIVE
PH: 5 (ref 5.0–8.0)
PROTEIN: NEGATIVE mg/dL
SPECIFIC GRAVITY, URINE: 1.024 (ref 1.005–1.030)
Squamous Epithelial / LPF: NONE SEEN

## 2016-10-11 MED ORDER — KETOROLAC TROMETHAMINE 10 MG PO TABS: 10.0000 mg | ORAL_TABLET | Freq: Four times a day (QID) | ORAL | 0 refills | Status: AC | PRN

## 2016-10-11 MED ORDER — POLYETHYLENE GLYCOL 3350 17 G PO PACK: 17.0000 g | PACK | Freq: Two times a day (BID) | ORAL | 0 refills | Status: AC

## 2016-10-11 MED ORDER — SODIUM CHLORIDE 0.9 % IV BOLUS (SEPSIS)
1000.0000 mL | Freq: Once | INTRAVENOUS | Status: AC
  Administered 2016-10-11: 1000 mL via INTRAVENOUS

## 2016-10-11 MED ORDER — METOCLOPRAMIDE HCL 5 MG/ML IJ SOLN
10.0000 mg | Freq: Once | INTRAMUSCULAR | Status: AC
  Administered 2016-10-11: 10 mg via INTRAVENOUS
  Filled 2016-10-11: qty 2

## 2016-10-11 MED ORDER — DOCUSATE SODIUM 100 MG PO CAPS: 100.0000 mg | ORAL_CAPSULE | Freq: Two times a day (BID) | ORAL | 0 refills | Status: AC

## 2016-10-11 MED ORDER — KETOROLAC TROMETHAMINE 30 MG/ML IJ SOLN
30.0000 mg | Freq: Once | INTRAMUSCULAR | Status: AC
  Administered 2016-10-11: 30 mg via INTRAVENOUS
  Filled 2016-10-11: qty 1

## 2016-10-11 MED ORDER — METOCLOPRAMIDE HCL 10 MG PO TABS: 10.0000 mg | ORAL_TABLET | Freq: Three times a day (TID) | ORAL | 1 refills | Status: AC

## 2016-10-11 NOTE — ED Notes (Signed)
Pt. Stated he had large bowel movement in room, pt. Feeling relief.

## 2016-10-11 NOTE — ED Notes (Signed)
Pt. Given instructions and education on foley and two different bags.

## 2016-10-11 NOTE — ED Notes (Signed)
Pt. Requested to try to have bowel movement before going home.  Pt. In room with wife.  Pt. Will hit call bell when ready.

## 2016-10-11 NOTE — Discharge Instructions (Signed)
Please follow up with urology and your primary care physician

## 2016-10-11 NOTE — ED Notes (Signed)
Pt. Going howe with wife.

## 2016-10-12 LAB — URINE CULTURE: CULTURE: NO GROWTH

## 2016-10-14 NOTE — Progress Notes (Signed)
10/15/2016 9:44 AM   Phillip Glenn Nov 28, 1975 [id number]  Referring provider: No referring provider defined for this encounter.  Chief Complaint  Patient presents with  . New Patient (Initial Visit)    referred by ER kidney stones    HPI: Patient is a 41 year old Caucasian male who is referred by [facility] ED for nephrolithiasis.  Patient states the onset of the pain was 10 days ago.   It was sharp in nature.  It lasted for several hours.  The pain was located in the right flank and radiated to his right groin.  The pain was a 7-8/10.  Nothing made the pain better.   Nothing made the pain worse.  He did not have gross hematuria, fevers or chills.  He did have nausea and vomiting.  He sought treatment in the ED on 10/05/2016.  CT Renal stone study at that time demonstrated a 2 mm distal right ureteral calculus causing very mild right hydronephrosis.   Punctate nonobstructing left renal calculus.  Two low-density left renal lesions -likely cysts. Consider ultrasound confirmation.  I have personally reviewed the films.  His serum creatinine was 1.03, his WBC count was 7.6 and his UA noted to numerous to count red blood cells.  He was given Toradol, fentanyl and Zofran in the ED. He was discharged with Flomax and Percocet.  He returned to the ED on 10/07/2016 with the complaint of intractable vomiting and pain.  He reported decreased urine output and said that he had not urinated the whole day prior to seeking treatment in the ED.  Patient had urinated 4 times the day of his ED visit. . No hematuria. No dysuria or frequency. He denied fever but has had chills. Patient also endorsed 3 days of constipation which is not common for him. No prior abdominal surgeries, no abdominal distention, no history of SBO. Patient is passing flatus. His serum creatinine had elevated to 1.45 and his WBC count elevated to 12.6,.  His UA was unremarkable.  He was given morphine, Toradol, ibuprofen and Zofran in  the ED.   He returned again on 10/10/2016 with some abdominal pain and difficulty urinating.  He has been unable to have a bowel movement for the past week. This is been going on since before he came with the kidney stone. He tried laxatives Colace and fiber all with no success. The patient rated his pain a 10 out of 10 in intensity. His pain is epigastric in the last time he urinated today was around 1 PM.  A repeated CT Renal stone study noted continued mild right hydronephrosis and hydroureter, this is secondary to a 2-3 mm stone in the distal right ureter which is now visualized at the right UVJ.  It may have passed into the bladder.  Low density lesions in the left kidney, probably cysts.  I have independently reviewed the films.  In the emergency room he was given Zofran, morphine, Toradol, Reglan, Colace and MiraLAX.  His serum creatinine at that time was 1.73, his WBC count was 11.6, his UA was unremarkable and his urine culture was negative.  A Foley was placed and 240 cc residual was received.  Today, he has the Foley in place.  He is desiring to have it removed.  He states he has had gross hematuria with the Foley.  He is having left flank pain today.  The right flank is not bothersome to him.  He has not passed a fragment. He has  not been given a strainer, so therefore he has not been straining his urine.  He has not had any recent fevers, chills, nausea or vomiting. He states this is improved as he is starting to have bowel movements again and has been switched to Toradol for the pain. He states he is taking one Toradol daily.  He also states that he has a history of a "shy" bladder and ED.  He states both issues have been occurring for several months.  He states that he is developing a weaker and weaker urinary stream. He states he is drinking copious amounts of fluid and effort to lose weight as he has a fatty liver.  In reference to the erectile dysfunction, he states he is able to get an  erection that they are starting to lack of firmness and frequency as he had in the past.     PMH: No past medical history on file.  Surgical History: Past Surgical History:  Procedure Laterality Date  . MOUTH SURGERY      Home Medications:  Allergies as of 10/15/2016      Reactions   Ace Inhibitors Hives   Codeine    Doxycycline Hives, Rash      Medication List       Accurate as of 10/15/16  9:44 AM. Always use your most recent med list.          acetaminophen 500 MG tablet Commonly known as:  TYLENOL Take 1,000 mg by mouth every 6 (six) hours as needed.   docusate sodium 100 MG capsule Commonly known as:  COLACE Take 1 capsule (100 mg total) by mouth 2 (two) times daily.   ibuprofen 800 MG tablet Commonly known as:  ADVIL,MOTRIN Take 1 tablet (800 mg total) by mouth every 8 (eight) hours as needed.   ketorolac 10 MG tablet Commonly known as:  TORADOL Take 1 tablet (10 mg total) by mouth every 6 (six) hours as needed.   metoCLOPramide 10 MG tablet Commonly known as:  REGLAN Take 1 tablet (10 mg total) by mouth 3 (three) times daily with meals.   ondansetron 4 MG disintegrating tablet Commonly known as:  ZOFRAN ODT Take 1 tablet (4 mg total) by mouth every 8 (eight) hours as needed for nausea or vomiting.   oxyCODONE-acetaminophen 5-325 MG tablet Commonly known as:  ROXICET Take 1 tablet by mouth every 6 (six) hours as needed.   polyethylene glycol packet Commonly known as:  MIRALAX Take 17 g by mouth 2 (two) times daily.   tamsulosin 0.4 MG Caps capsule Commonly known as:  FLOMAX Take 1 capsule (0.4 mg total) by mouth daily.       Allergies:  Allergies  Allergen Reactions  . Ace Inhibitors Hives  . Codeine   . Doxycycline Hives and Rash    Family History: Family History  Problem Relation Age of Onset  . Colon cancer Father   . Liver cancer Father   . Kidney cancer Neg Hx   . Bladder Cancer Neg Hx   . Prostate cancer Neg Hx     Social  History:  reports that he has never smoked. He has never used smokeless tobacco. He reports that he does not drink alcohol or use drugs.  ROS: UROLOGY Frequent Urination?: No Hard to postpone urination?: No Burning/pain with urination?: No Get up at night to urinate?: No Leakage of urine?: No Urine stream starts and stops?: Yes Trouble starting stream?: Yes Do you have to strain to urinate?:  Yes Blood in urine?: Yes Urinary tract infection?: No Sexually transmitted disease?: No Injury to kidneys or bladder?: Yes Painful intercourse?: No Weak stream?: Yes Erection problems?: Yes Penile pain?: No  Gastrointestinal Nausea?: Yes Vomiting?: Yes Indigestion/heartburn?: No Diarrhea?: No Constipation?: Yes  Constitutional Fever: No Night sweats?: Yes Weight loss?: No Fatigue?: Yes  Skin Skin rash/lesions?: No Itching?: No  Eyes Blurred vision?: No Double vision?: No  Ears/Nose/Throat Sore throat?: No Sinus problems?: No  Hematologic/Lymphatic Swollen glands?: No Easy bruising?: No  Cardiovascular Leg swelling?: No Chest pain?: No  Respiratory Cough?: Yes Shortness of breath?: No  Endocrine Excessive thirst?: No  Musculoskeletal Back pain?: Yes Joint pain?: Yes  Neurological Headaches?: Yes Dizziness?: Yes  Psychologic Depression?: No Anxiety?: No  Physical Exam: BP (!) 159/107   Pulse 78   Ht 6' 6"  (1.981 m)   Wt (!) 313 lb 9.6 oz (142.2 kg)   BMI 36.24 kg/m   Constitutional: Well nourished. Alert and oriented, No acute distress. HEENT: [place] AT, moist mucus membranes. Trachea midline, no masses. Cardiovascular: No clubbing, cyanosis, or edema. Respiratory: Normal respiratory effort, no increased work of breathing. GI: Abdomen is soft, non tender, non distended, no abdominal masses. Liver and spleen not palpable.  No hernias appreciated.  Stool sample for occult testing is not indicated.   GU: No CVA tenderness.  No bladder fullness or  masses.  Patient with circumcised phallus. Urethral meatus is patent.  No penile discharge. No penile lesions or rashes. Scrotum without lesions, cysts, rashes and/or edema.  Testicles are located scrotally bilaterally. No masses are appreciated in the testicles. Left and right epididymis are normal. Rectal: Deferred.   Skin: No rashes, bruises or suspicious lesions. Lymph: No cervical or inguinal adenopathy. Neurologic: Grossly intact, no focal deficits, moving all 4 extremities. Psychiatric: Normal mood and affect.  Laboratory Data: Lab Results  Component Value Date   WBC 11.6 (H) 10/10/2016   HGB 14.1 10/10/2016   HCT 40.3 10/10/2016   MCV 86.6 10/10/2016   PLT 269 10/10/2016    Lab Results  Component Value Date   CREATININE 1.73 (H) 10/10/2016    Lab Results  Component Value Date   AST 25 10/10/2016   Lab Results  Component Value Date   ALT 34 10/10/2016     Pertinent Imaging: CLINICAL DATA:  41 year old male with right flank and abdominal pain for 1 day.  EXAM: CT ABDOMEN AND PELVIS WITHOUT CONTRAST  TECHNIQUE: Multidetector CT imaging of the abdomen and pelvis was performed following the standard protocol without IV contrast.  COMPARISON:  None.  FINDINGS: Please note that parenchymal abnormalities may be missed without intravenous contrast.  Lower chest: No acute abnormality  Hepatobiliary: The liver and gallbladder are unremarkable. There is no evidence of biliary dilatation.  Pancreas: Unremarkable  Spleen: Unremarkable  Adrenals/Urinary Tract: A 2 mm distal right ureteral calculus (5 cm above the UVJ) causes very mild right hydronephrosis. A punctate nonobstructing left renal calculus is noted. Two low-density left renal lesions probably represent cysts. The adrenal glands and bladder are unremarkable.  Stomach/Bowel: Stomach is within normal limits. Appendix appears normal. No evidence of bowel wall thickening, distention,  or inflammatory changes.  Vascular/Lymphatic: No significant vascular findings are present. No enlarged abdominal or pelvic lymph nodes.  Reproductive: Prostate is unremarkable.  Other: No ascites, focal collection or pneumoperitoneum.  Musculoskeletal: No acute or significant osseous findings.  IMPRESSION: 2 mm distal right ureteral calculus causing very mild right hydronephrosis.  Punctate nonobstructing left renal calculus.  Two low-density left renal lesions -likely cysts. Consider ultrasound confirmation.   Electronically Signed   By: Margarette Canada M.D.   On: 10/05/2016 09:01  CLINICAL DATA:  Right flank pain with urinary retention  EXAM: CT ABDOMEN AND PELVIS WITHOUT CONTRAST  TECHNIQUE: Multidetector CT imaging of the abdomen and pelvis was performed following the standard protocol without IV contrast.  COMPARISON:  10/05/2016  FINDINGS: Lower chest: Lung bases demonstrate no acute consolidation or pleural effusion. Normal heart size.  Hepatobiliary: No focal liver abnormality is seen. No gallstones, gallbladder wall thickening, or biliary dilatation.  Pancreas: Unremarkable. No pancreatic ductal dilatation or surrounding inflammatory changes.  Spleen: Normal in size without focal abnormality.  Adrenals/Urinary Tract: Adrenal glands are within normal limits. Low-density lesions in the mid and upper left kidney with minimal calcification. Mild right hydronephrosis and hydroureter, secondary to a 3 mm stone which is now located at the right UVJ. Foley catheter in the bladder.  Stomach/Bowel: Stomach is within normal limits. Appendix appears normal. No evidence of bowel wall thickening, distention, or inflammatory changes.  Vascular/Lymphatic: No significant vascular findings are present. No enlarged abdominal or pelvic lymph nodes.  Reproductive: Prostate is unremarkable.  Other: Small amount of free fluid in the pelvis. No free  air. Small fat in the umbilicus.  Musculoskeletal: No acute or significant osseous findings.  IMPRESSION: 1. Continued mild right hydronephrosis and hydroureter, this is secondary to a 2-3 mm stone in the distal right ureter which is now visualized at the right UVJ. 2. Low density lesions in the left kidney, probably cysts   Electronically Signed   By: Donavan Foil M.D.   On: 10/11/2016 01:18      Assessment & Plan:   1. Right ureteral stone  - explained to the patient that AUA Guidelines for patients with uncomplicated ureteral stones ?10 mm should be offered observation, and those with distal stones of similar size should be offered MET with ?-blockers  - no further right flank pain - stone most likely passed - strainer is given - will continue the tamsulosin  - Advised to contact our office or seek treatment in the ED if becomes febrile or pain/ vomiting are difficult control in order to arrange for emergent/urgent intervention   2. Right hydronephrosis  - obtain RUS to ensure the hydronephrosis has resolved.    3. Left renal stone  - Patient now with left flank pain  - He will be undergoing a renal ultrasound  - Advised to contact our office or seek treatment in the ED if becomes febrile or pain/ vomiting are difficult control in order to arrange for emergent/urgent intervention  4. Incomplete bladder emptying  - foley catheter removed  -voiding trial today    -return if unable to urinate or experiencing suprapubic discomfort  -Continue the tamsulosin  -follow-up in one month for I PSS score, PVR and exam.  5. Erectile dysfunction  - Patient advised to speak with his PCP regarding cardiac risk factors as erectile dysfunction is a possible sign of cardiac disease especially in younger men - he did admit he has hyperlipidemia  6. Left renal lesion  - RUS pending     Return for RUS report .  These notes generated with voice recognition software. I apologize  for typographical errors.  Zara Council, [facility] [address] [place], [place] [id number] [phone]

## 2016-10-15 ENCOUNTER — Encounter: Payer: Self-pay | Admitting: Urology

## 2016-10-15 ENCOUNTER — Ambulatory Visit (INDEPENDENT_AMBULATORY_CARE_PROVIDER_SITE_OTHER): Payer: 59 | Admitting: Urology

## 2016-10-15 VITALS — BP 159/107 | HR 78 | Ht 78.0 in | Wt 313.6 lb

## 2016-10-15 DIAGNOSIS — R339 Retention of urine, unspecified: Secondary | ICD-10-CM

## 2016-10-15 DIAGNOSIS — N200 Calculus of kidney: Secondary | ICD-10-CM | POA: Diagnosis not present

## 2016-10-15 DIAGNOSIS — N201 Calculus of ureter: Secondary | ICD-10-CM | POA: Diagnosis not present

## 2016-10-15 DIAGNOSIS — N132 Hydronephrosis with renal and ureteral calculous obstruction: Secondary | ICD-10-CM

## 2016-10-15 DIAGNOSIS — N171 Acute kidney failure with acute cortical necrosis: Secondary | ICD-10-CM | POA: Diagnosis not present

## 2016-10-15 DIAGNOSIS — N529 Male erectile dysfunction, unspecified: Secondary | ICD-10-CM | POA: Diagnosis not present

## 2016-11-04 ENCOUNTER — Ambulatory Visit
Admission: RE | Admit: 2016-11-04 | Discharge: 2016-11-04 | Disposition: A | Discharge status: Home / Self Care | Payer: 59 | Source: Ambulatory Visit | Attending: Urology | Admitting: Urology

## 2016-11-04 DIAGNOSIS — N281 Cyst of kidney, acquired: Secondary | ICD-10-CM | POA: Diagnosis not present

## 2016-11-04 DIAGNOSIS — R109 Unspecified abdominal pain: Secondary | ICD-10-CM | POA: Diagnosis not present

## 2016-11-04 DIAGNOSIS — N200 Calculus of kidney: Secondary | ICD-10-CM

## 2016-11-04 DIAGNOSIS — N132 Hydronephrosis with renal and ureteral calculous obstruction: Secondary | ICD-10-CM

## 2016-11-09 NOTE — Progress Notes (Signed)
11/10/2016 2:20 PM   Phillip Glenn 02-12-1976 [id number]  Referring provider: No referring provider defined for this encounter.  Chief Complaint  Patient presents with  . Results    RUS    HPI: 41 yo WM who presents today for a RUS report.  Background history Patient is a 41 year old Caucasian male who is referred by [facility] ED for nephrolithiasis.  Patient states the onset of the pain was 10 days ago.   It was sharp in nature.  It lasted for several hours.  The pain was located in the right flank and radiated to his right groin.  The pain was a 7-8/10.  Nothing made the pain better.   Nothing made the pain worse.  He did not have gross hematuria, fevers or chills.  He did have nausea and vomiting.  He sought treatment in the ED on 10/05/2016.  CT Renal stone study at that time demonstrated a 2 mm distal right ureteral calculus causing very mild right hydronephrosis.  Punctate nonobstructing left renal calculus.  Two low-density left renal lesions -likely cysts. Consider ultrasound confirmation.  I have personally reviewed the films.  His serum creatinine was 1.03, his WBC count was 7.6 and his UA noted to numerous to count red blood cells.  He was given Toradol, fentanyl and Zofran in the ED. He was discharged with Flomax and Percocet.  He returned to the ED on 10/07/2016 with the complaint of intractable vomiting and pain.  He reported decreased urine output and said that he had not urinated the whole day prior to seeking treatment in the ED.  Patient had urinated 4 times the day of his ED visit. . No hematuria. No dysuria or frequency. He denied fever but has had chills. Patient also endorsed 3 days of constipation which is not common for him. No prior abdominal surgeries, no abdominal distention, no history of SBO. Patient is passing flatus. His serum creatinine had elevated to 1.45 and his WBC count elevated to 12.6,.  His UA was unremarkable.  He was given morphine, Toradol,  ibuprofen and Zofran in the ED.  He returned again on 10/10/2016 with some abdominal pain and difficulty urinating.  He has been unable to have a bowel movement for the past week. This is been going on since before he came with the kidney stone. He tried laxatives Colace and fiber all with no success. The patient rated his pain a 10 out of 10 in intensity. His pain is epigastric in the last time he urinated today was around 1 PM.  A repeated CT Renal stone study noted continued mild right hydronephrosis and hydroureter, this is secondary to a 2-3 mm stone in the distal right ureter which is now visualized at the right UVJ.  It may have passed into the bladder.  Low density lesions in the left kidney, probably cysts.  I have independently reviewed the films.  In the emergency room he was given Zofran, morphine, Toradol, Reglan, Colace and MiraLAX.  His serum creatinine at that time was 1.73, his WBC count was 11.6, his UA was unremarkable and his urine culture was negative.  A Foley was placed and 240 cc residual was received.  Today, he has the Foley in place.  He is desiring to have it removed.  He states he has had gross hematuria with the Foley.  He is having left flank pain today.  The right flank is not bothersome to him.  He has not passed  a fragment. He has not been given a strainer, so therefore he has not been straining his urine.  He has not had any recent fevers, chills, nausea or vomiting. He states this is improved as he is starting to have bowel movements again and has been switched to Toradol for the pain. He states he is taking one Toradol daily.  He also states that he has a history of a "shy" bladder and ED.  He states both issues have been occurring for several months.  He states that he is developing a weaker and weaker urinary stream. He states he is drinking copious amounts of fluid and effort to lose weight as he has a fatty liver.  In reference to the erectile dysfunction, he states he is  able to get an erection that they are starting to lack of firmness and frequency as he had in the past.    Today, he is still having some mild right flank pain.  He has not passed a stone.  He is not having gross hematuria or dysuria.  He is not having fevers, chills, nausea or vomiting.     He is urinating better on the tamsulosin and his erections have improved.    RUS performed on 11/04/2016 noted no acute finding by renal ultrasound.  Negative for hydronephrosis.  Minimally complex septated left renal cysts (2).   PMH: Past Medical History:  Diagnosis Date  . Kidney stone     Surgical History: Past Surgical History:  Procedure Laterality Date  . ADENOIDECTOMY    . MOUTH SURGERY    . tubes in ear      Home Medications:  Allergies as of 11/10/2016      Reactions   Ace Inhibitors Hives   Codeine    Doxycycline Hives, Rash      Medication List       Accurate as of 11/10/16  2:20 PM. Always use your most recent med list.          acetaminophen 500 MG tablet Commonly known as:  TYLENOL Take 1,000 mg by mouth every 6 (six) hours as needed.   docusate sodium 100 MG capsule Commonly known as:  COLACE Take 1 capsule (100 mg total) by mouth 2 (two) times daily.   ibuprofen 800 MG tablet Commonly known as:  ADVIL,MOTRIN Take 1 tablet (800 mg total) by mouth every 8 (eight) hours as needed.   ketorolac 10 MG tablet Commonly known as:  TORADOL Take 1 tablet (10 mg total) by mouth every 6 (six) hours as needed.   metoCLOPramide 10 MG tablet Commonly known as:  REGLAN Take 1 tablet (10 mg total) by mouth 3 (three) times daily with meals.   ondansetron 4 MG disintegrating tablet Commonly known as:  ZOFRAN ODT Take 1 tablet (4 mg total) by mouth every 8 (eight) hours as needed for nausea or vomiting.   oxyCODONE-acetaminophen 5-325 MG tablet Commonly known as:  ROXICET Take 1 tablet by mouth every 6 (six) hours as needed.   polyethylene glycol packet Commonly known  as:  MIRALAX Take 17 g by mouth 2 (two) times daily.   tamsulosin 0.4 MG Caps capsule Commonly known as:  FLOMAX Take 1 capsule (0.4 mg total) by mouth daily.       Allergies:  Allergies  Allergen Reactions  . Ace Inhibitors Hives  . Codeine   . Doxycycline Hives and Rash    Family History: Family History  Problem Relation Age of Onset  . Colon  cancer Father   . Liver cancer Father   . Kidney cancer Neg Hx   . Bladder Cancer Neg Hx   . Prostate cancer Neg Hx     Social History:  reports that he has never smoked. He has never used smokeless tobacco. He reports that he does not drink alcohol or use drugs.  ROS: UROLOGY Frequent Urination?: No Hard to postpone urination?: No Burning/pain with urination?: No Get up at night to urinate?: No Leakage of urine?: No Urine stream starts and stops?: No Trouble starting stream?: No Do you have to strain to urinate?: No Blood in urine?: No Urinary tract infection?: No Sexually transmitted disease?: No Injury to kidneys or bladder?: No Painful intercourse?: No Weak stream?: No Erection problems?: No Penile pain?: No  Gastrointestinal Nausea?: No Vomiting?: No Indigestion/heartburn?: No Diarrhea?: No Constipation?: No  Constitutional Fever: No Night sweats?: No Weight loss?: No Fatigue?: No  Skin Skin rash/lesions?: No Itching?: No  Eyes Blurred vision?: No Double vision?: No  Ears/Nose/Throat Sore throat?: No Sinus problems?: No  Hematologic/Lymphatic Swollen glands?: No Easy bruising?: No  Cardiovascular Leg swelling?: No Chest pain?: No  Respiratory Cough?: No Shortness of breath?: No  Endocrine Excessive thirst?: No  Musculoskeletal Back pain?: Yes Joint pain?: No  Neurological Headaches?: No Dizziness?: No  Psychologic Depression?: No Anxiety?: No  Physical Exam: BP (!) 141/91   Pulse 92   Ht 6\' 6"  (1.981 m)   Wt (!) 321 lb 6.4 oz (145.8 kg)   BMI 37.14 kg/m     Constitutional: Well nourished. Alert and oriented, No acute distress. HEENT: [place] AT, moist mucus membranes. Trachea midline, no masses. Cardiovascular: No clubbing, cyanosis, or edema. Respiratory: Normal respiratory effort, no increased work of breathing. GI: Abdomen is soft, non tender, non distended, no abdominal masses. Liver and spleen not palpable.  No hernias appreciated.    GU: No CVA tenderness.  No bladder fullness or masses.   Skin: No rashes, bruises or suspicious lesions. Lymph: No cervical or inguinal adenopathy. Neurologic: Grossly intact, no focal deficits, moving all 4 extremities. Psychiatric: Normal mood and affect.  Laboratory Data: Lab Results  Component Value Date   WBC 11.6 (H) 10/10/2016   HGB 14.1 10/10/2016   HCT 40.3 10/10/2016   MCV 86.6 10/10/2016   PLT 269 10/10/2016    Lab Results  Component Value Date   CREATININE 1.73 (H) 10/10/2016    Lab Results  Component Value Date   AST 25 10/10/2016   Lab Results  Component Value Date   ALT 34 10/10/2016   I have reviewed the labs  Pertinent Imaging: CLINICAL DATA:  Nephrolithiasis  EXAM: RENAL / URINARY TRACT ULTRASOUND COMPLETE  COMPARISON:  10/11/2016, 10/05/2016  FINDINGS: Right Kidney:  Length: 12.7 cm. Echogenicity within normal limits. No mass or hydronephrosis visualized.  Left Kidney:  Length: 12.7 cm. Normal echogenicity and cortical thickness. No hydronephrosis. Left mid and lower pole minimally complex septated anechoic cysts with peripheral wall calcifications noted. Midpole cyst measures 2.2 cm and lower pole cyst measures 2.3 cm  Bladder:  Appears normal for degree of bladder distention. Ureteral jets demonstrated bilaterally  IMPRESSION: No acute finding by renal ultrasound.  Negative for hydronephrosis.  Minimally complex septated left renal cysts (2).   Electronically Signed   By: Judie Petit.  Shick M.D.   On: 11/04/2016 13:13  I have independently  reviewed the films  Assessment & Plan:   1. Right ureteral stone  - no further right flank pain -  stone most likely passed - no hydro seen on recent RUS  - Advised to contact our office or seek treatment in the ED if becomes febrile or pain/ vomiting are difficult control in order to arrange for emergent/urgent intervention  2. Right hydronephrosis  - the hydronephrosis has resolved.    3. Left renal stone  - no hydro or stone seen on the left  4. Incomplete bladder emptying  - patient voiding without issue  5. Erectile dysfunction  - improved  6. Left renal lesion  - Minimally complex septated left renal cysts - repeat RUS in 6 months  Return in about 6 months (around 05/13/2017) for RUS report.  These notes generated with voice recognition software. I apologize for typographical errors.  Michiel CowboySHANNON Kree Rafter, PA-C  [facility] [address], [address] [place], [place] [address] [phone]

## 2016-11-10 ENCOUNTER — Ambulatory Visit (INDEPENDENT_AMBULATORY_CARE_PROVIDER_SITE_OTHER): Payer: 59 | Admitting: Urology

## 2016-11-10 ENCOUNTER — Encounter: Payer: Self-pay | Admitting: Urology

## 2016-11-10 VITALS — BP 141/91 | HR 92 | Ht 78.0 in | Wt 321.4 lb

## 2016-11-10 DIAGNOSIS — N281 Cyst of kidney, acquired: Secondary | ICD-10-CM | POA: Diagnosis not present

## 2016-11-10 DIAGNOSIS — N200 Calculus of kidney: Secondary | ICD-10-CM | POA: Diagnosis not present

## 2016-11-10 DIAGNOSIS — N201 Calculus of ureter: Secondary | ICD-10-CM | POA: Diagnosis not present

## 2016-11-10 DIAGNOSIS — R339 Retention of urine, unspecified: Secondary | ICD-10-CM | POA: Diagnosis not present

## 2016-11-10 DIAGNOSIS — N529 Male erectile dysfunction, unspecified: Secondary | ICD-10-CM

## 2016-11-10 DIAGNOSIS — N132 Hydronephrosis with renal and ureteral calculous obstruction: Secondary | ICD-10-CM

## 2017-05-08 ENCOUNTER — Telehealth: Payer: Self-pay | Admitting: Urology

## 2017-05-08 NOTE — Telephone Encounter (Signed)
Patient cx his appts he has moved out of the area and will not be coming back to us.  Phillip DusterMichelle

## 2017-05-12 ENCOUNTER — Ambulatory Visit: Payer: 59 | Admitting: Urology

## 2018-03-09 IMAGING — CT CT RENAL STONE PROTOCOL
2 of 5 series · 16 of 46 positions shown, 18 images · non-contrast
Comparison: 10/05/2016

CLINICAL DATA: Right flank pain with urinary retention

EXAM:
CT ABDOMEN AND PELVIS WITHOUT CONTRAST
TECHNIQUE: Multidetector CT imaging of the abdomen and pelvis was performed
following the standard protocol without IV contrast.

[Series 2: thins · axial · 0.98mm/px · z∈[-1035,-474]mm · 13 of 613 slices shown, 15 images]
[im 26/613  soft-tissue]
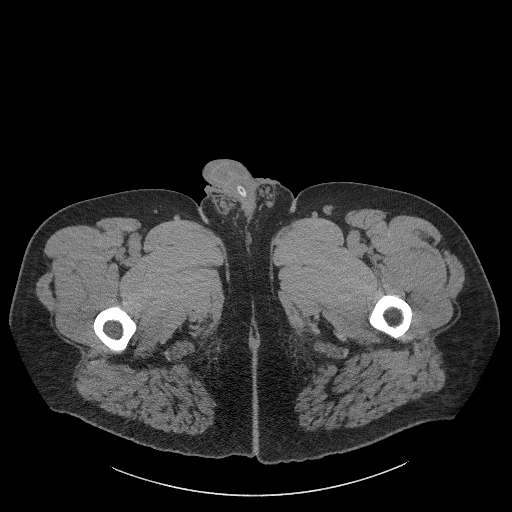
[im 26/613  bone]
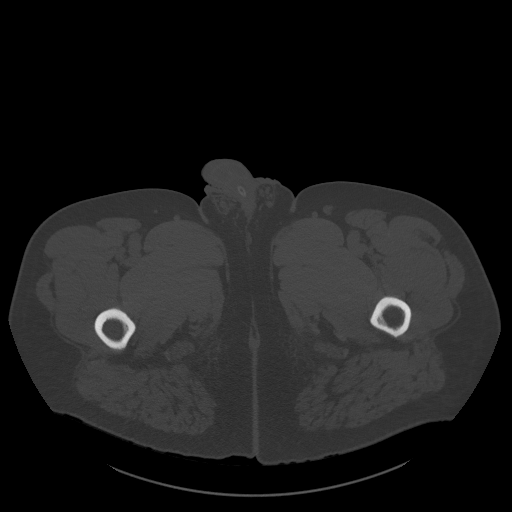
[im 77/613  soft-tissue]
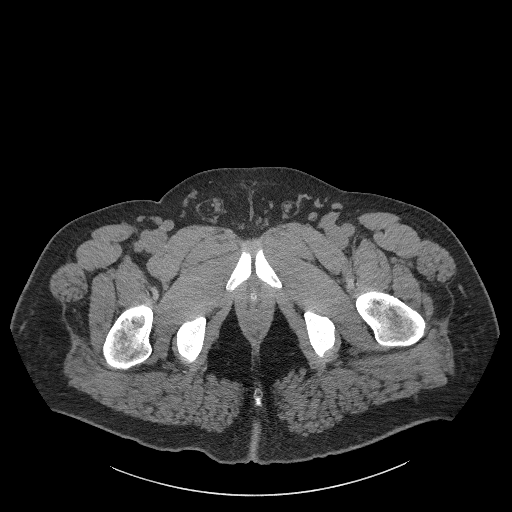
[im 128/613  soft-tissue]
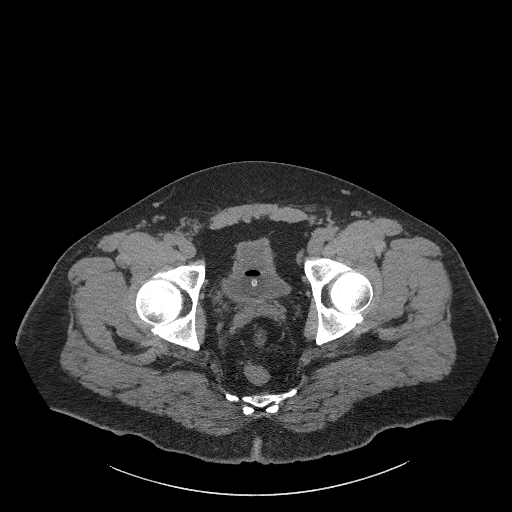
[im 179/613  soft-tissue]
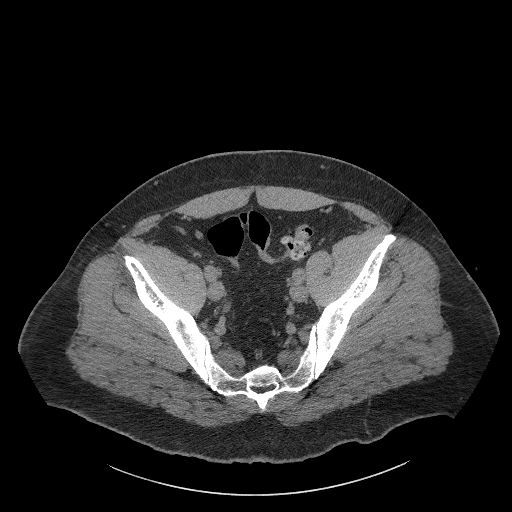
[im 205/613  soft-tissue]
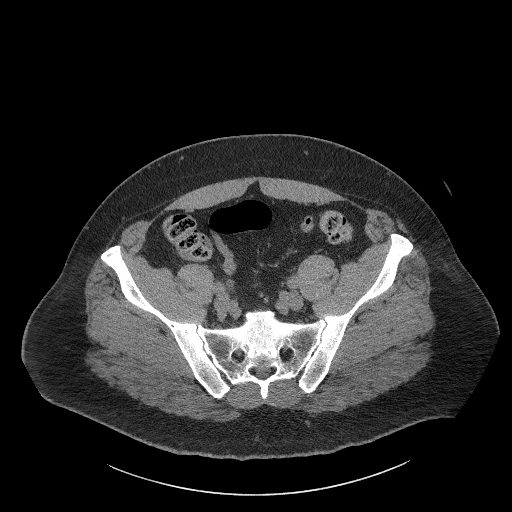
[im 256/613  soft-tissue]
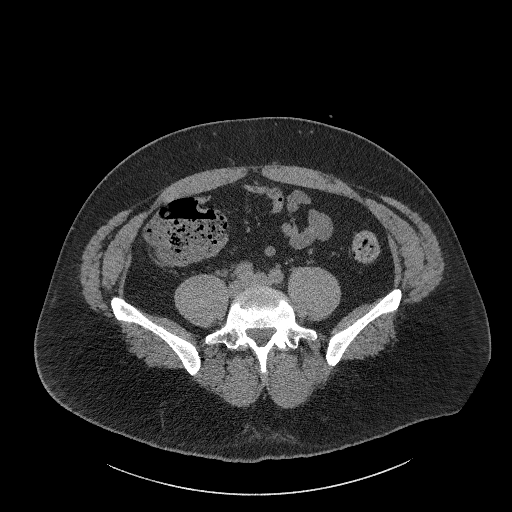
[im 307/613  soft-tissue]
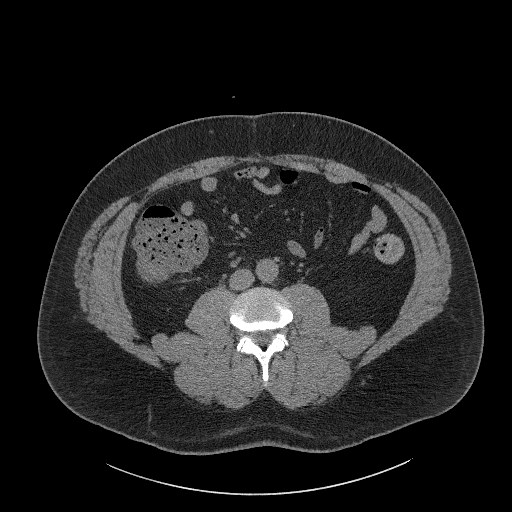
[im 358/613  soft-tissue]
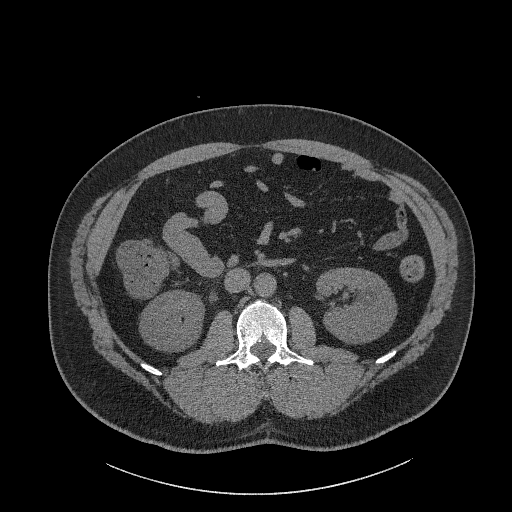
[im 409/613  soft-tissue]
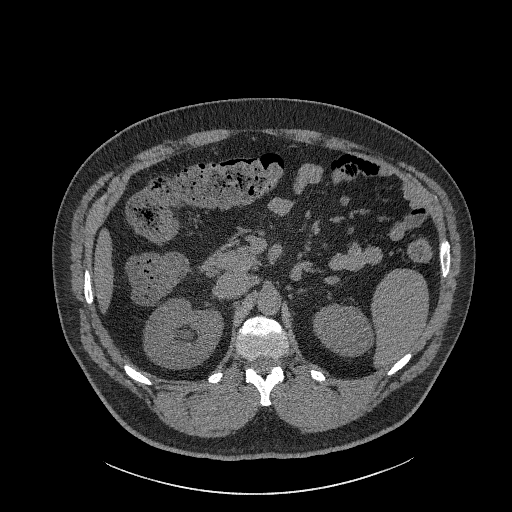
[im 409/613  bone]
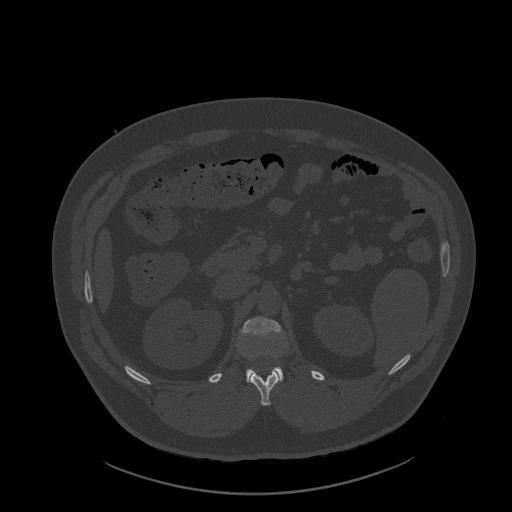
[im 434/613  soft-tissue]
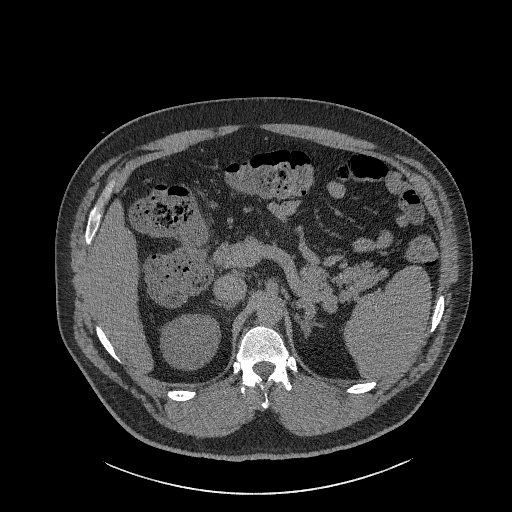
[im 485/613  soft-tissue]
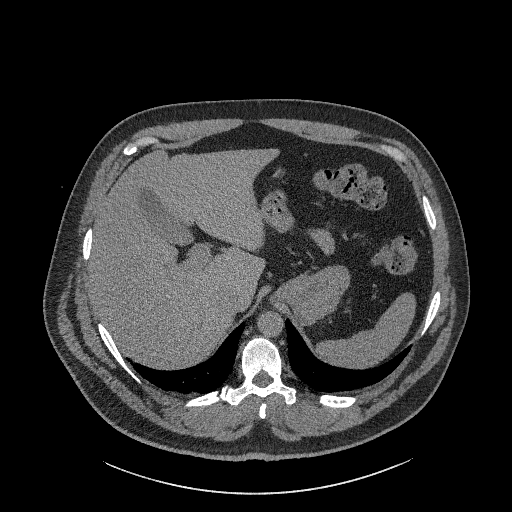
[im 536/613  soft-tissue]
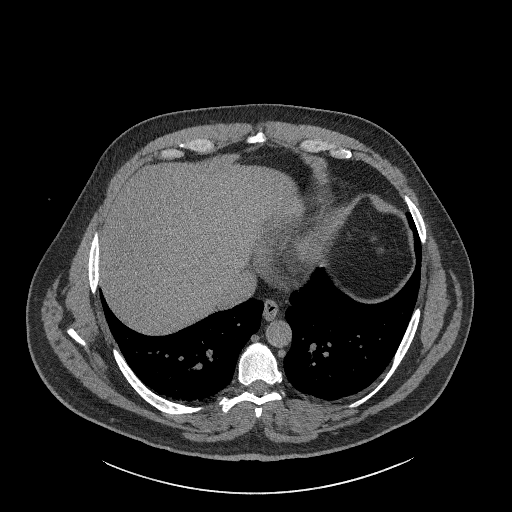
[im 587/613  soft-tissue]
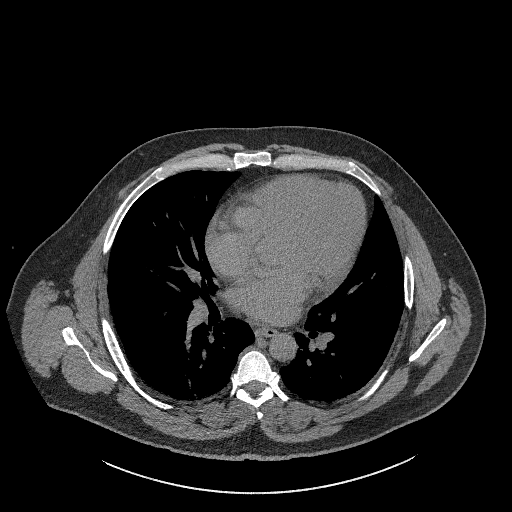

[Series 4: coronal · coronal · 0.97mm/px · 3 of 202 slices shown]
[im 68/202  soft-tissue]
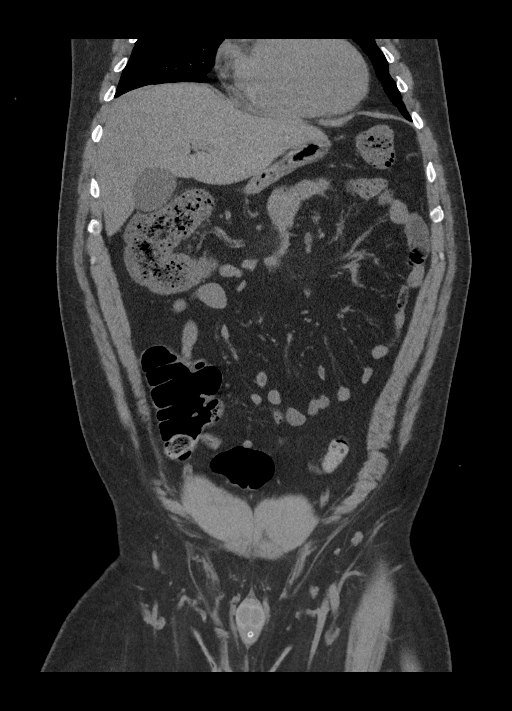
[im 90/202  soft-tissue]
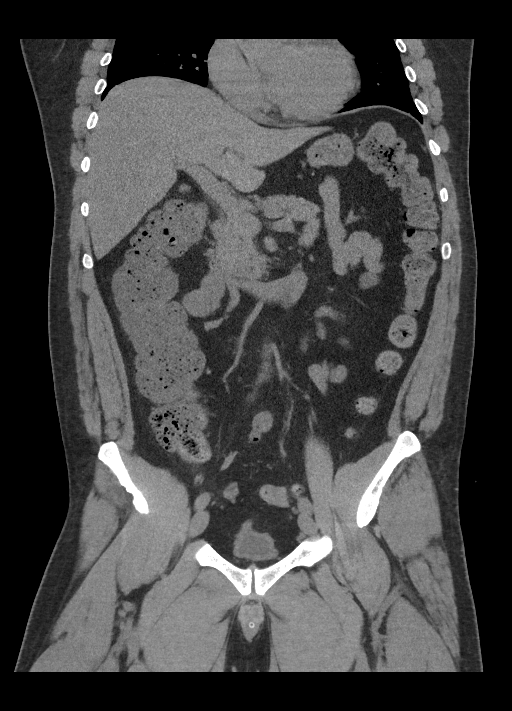
[im 112/202  soft-tissue]
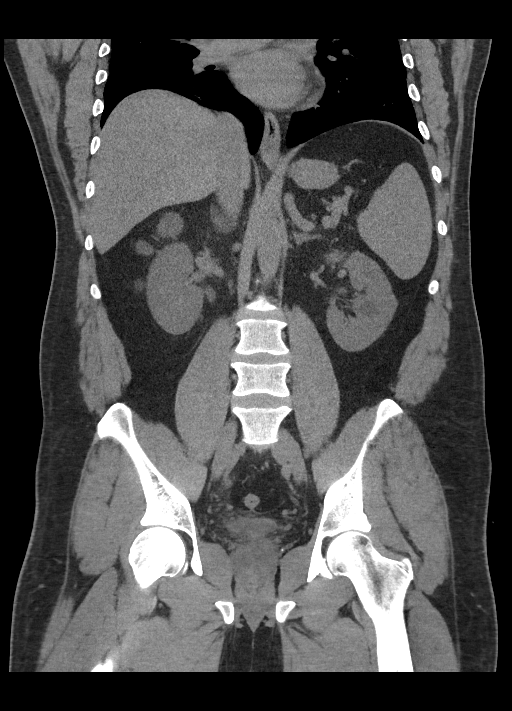

[16 of 46 positions shown; findings below may reference images not displayed]

FINDINGS: Lower chest: Lung bases demonstrate no acute consolidation or
pleural effusion. Normal heart size.

Hepatobiliary: No focal liver abnormality is seen. No gallstones,
gallbladder wall thickening, or biliary dilatation.

Pancreas: Unremarkable. No pancreatic ductal dilatation or
surrounding inflammatory changes.

Spleen: Normal in size without focal abnormality.

Adrenals/Urinary Tract: Adrenal glands are within normal limits.
Low-density lesions in the mid and upper left kidney with minimal
calcification. Mild right hydronephrosis and hydroureter, secondary
to a 3 mm stone which is now located at the right UVJ. Foley
catheter in the bladder.

Stomach/Bowel: Stomach is within normal limits. Appendix appears
normal. No evidence of bowel wall thickening, distention, or
inflammatory changes.

Vascular/Lymphatic: No significant vascular findings are present. No
enlarged abdominal or pelvic lymph nodes.

Reproductive: Prostate is unremarkable.

Other: Small amount of free fluid in the pelvis. No free air. Small
fat in the umbilicus.

Musculoskeletal: No acute or significant osseous findings.
IMPRESSION: 1. Continued mild right hydronephrosis and hydroureter, this is
secondary to a 2-3 mm stone in the distal right ureter which is now
visualized at the right UVJ.
2. Low density lesions in the left kidney, probably cysts
